# Patient Record
Sex: Female | Born: 1955 | Race: White | Hispanic: No | Smoking: Never smoker
Health system: Southern US, Community
[De-identification: ages and names within clinical notes are randomized; demographics above are authoritative.]

## PROBLEM LIST (undated history)

## (undated) DIAGNOSIS — M545 Low back pain, unspecified: Secondary | ICD-10-CM

## (undated) DIAGNOSIS — N959 Unspecified menopausal and perimenopausal disorder: Secondary | ICD-10-CM

## (undated) DIAGNOSIS — D649 Anemia, unspecified: Secondary | ICD-10-CM

## (undated) DIAGNOSIS — F329 Major depressive disorder, single episode, unspecified: Secondary | ICD-10-CM

## (undated) DIAGNOSIS — F3289 Other specified depressive episodes: Secondary | ICD-10-CM

## (undated) DIAGNOSIS — F411 Generalized anxiety disorder: Secondary | ICD-10-CM

## (undated) DIAGNOSIS — K219 Gastro-esophageal reflux disease without esophagitis: Secondary | ICD-10-CM

## (undated) DIAGNOSIS — E785 Hyperlipidemia, unspecified: Secondary | ICD-10-CM

## (undated) DIAGNOSIS — Z86010 Personal history of colonic polyps: Secondary | ICD-10-CM

## (undated) HISTORY — DX: Low back pain: M54.5

## (undated) HISTORY — PX: TUBAL LIGATION: SHX77

## (undated) HISTORY — DX: Unspecified menopausal and perimenopausal disorder: N95.9

## (undated) HISTORY — DX: Other specified depressive episodes: F32.89

## (undated) HISTORY — DX: Anemia, unspecified: D64.9

## (undated) HISTORY — DX: Major depressive disorder, single episode, unspecified: F32.9

## (undated) HISTORY — DX: Personal history of colonic polyps: Z86.010

## (undated) HISTORY — DX: Gastro-esophageal reflux disease without esophagitis: K21.9

## (undated) HISTORY — DX: Hyperlipidemia, unspecified: E78.5

## (undated) HISTORY — PX: TONSILLECTOMY AND ADENOIDECTOMY: SUR1326

## (undated) HISTORY — DX: Generalized anxiety disorder: F41.1

## (undated) HISTORY — DX: Low back pain, unspecified: M54.50

---

## 2000-07-06 ENCOUNTER — Encounter: Payer: Self-pay | Admitting: Gastroenterology

## 2000-07-06 ENCOUNTER — Encounter (INDEPENDENT_AMBULATORY_CARE_PROVIDER_SITE_OTHER): Payer: Self-pay | Admitting: Specialist

## 2000-07-06 ENCOUNTER — Other Ambulatory Visit: Admission: RE | Admit: 2000-07-06 | Discharge: 2000-07-06 | Payer: Self-pay | Admitting: Gastroenterology

## 2003-03-10 HISTORY — PX: UPPER GASTROINTESTINAL ENDOSCOPY: SHX188

## 2004-03-20 ENCOUNTER — Ambulatory Visit: Payer: Self-pay | Admitting: Internal Medicine

## 2004-04-01 ENCOUNTER — Ambulatory Visit: Payer: Self-pay | Admitting: Internal Medicine

## 2004-04-23 ENCOUNTER — Ambulatory Visit: Payer: Self-pay | Admitting: Internal Medicine

## 2005-04-28 ENCOUNTER — Ambulatory Visit: Payer: Self-pay | Admitting: Internal Medicine

## 2005-05-21 ENCOUNTER — Ambulatory Visit: Payer: Self-pay | Admitting: Internal Medicine

## 2005-05-21 ENCOUNTER — Ambulatory Visit: Payer: Self-pay | Admitting: Cardiology

## 2005-05-21 ENCOUNTER — Inpatient Hospital Stay (HOSPITAL_COMMUNITY): Admission: AD | Admit: 2005-05-21 | Discharge: 2005-05-25 | Payer: Self-pay | Admitting: Internal Medicine

## 2005-05-22 ENCOUNTER — Encounter: Payer: Self-pay | Admitting: Internal Medicine

## 2006-02-05 ENCOUNTER — Ambulatory Visit: Payer: Self-pay | Admitting: Internal Medicine

## 2006-07-27 ENCOUNTER — Encounter: Payer: Self-pay | Admitting: Internal Medicine

## 2007-02-21 IMAGING — CR DG CHEST 1V PORT
1 series · 1 of 1 positions shown · non-contrast
Comparison: None.

CLINICAL DATA: Left chest pain.  
 PORTABLE CHEST - 1 VIEW:

[view not recorded]
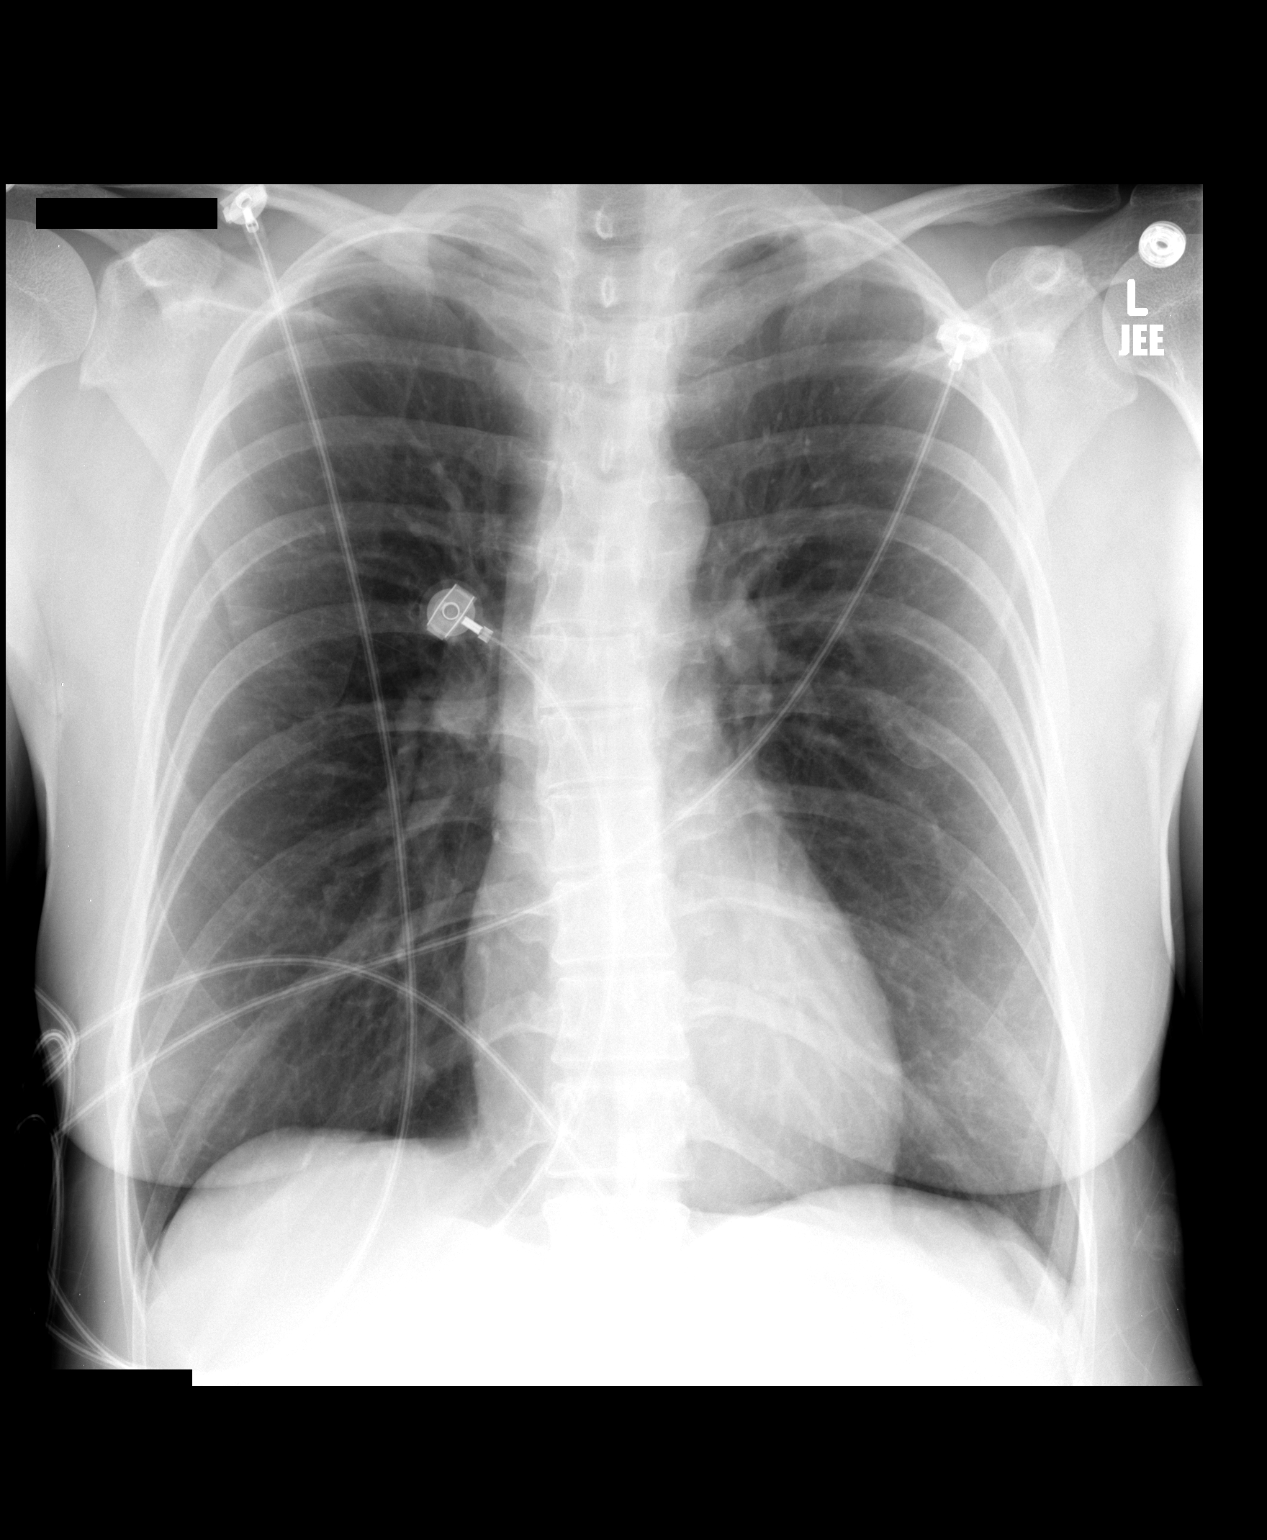

[1 of 1 positions shown; findings below may reference images not displayed]

FINDINGS: Heart and vascularity normal.  Lungs clear.  There may be some hyperaeration raising the question of COPD.   There is no acute process.
IMPRESSION: Possible COPD - no active disease.

## 2007-03-10 HISTORY — PX: REFRACTIVE SURGERY: SHX103

## 2007-05-30 ENCOUNTER — Ambulatory Visit: Payer: Self-pay | Admitting: Internal Medicine

## 2007-05-30 DIAGNOSIS — D130 Benign neoplasm of esophagus: Secondary | ICD-10-CM

## 2007-05-30 DIAGNOSIS — F329 Major depressive disorder, single episode, unspecified: Secondary | ICD-10-CM

## 2007-05-30 DIAGNOSIS — E785 Hyperlipidemia, unspecified: Secondary | ICD-10-CM

## 2007-05-30 DIAGNOSIS — F411 Generalized anxiety disorder: Secondary | ICD-10-CM | POA: Insufficient documentation

## 2007-06-01 ENCOUNTER — Encounter (INDEPENDENT_AMBULATORY_CARE_PROVIDER_SITE_OTHER): Payer: Self-pay | Admitting: *Deleted

## 2007-06-06 ENCOUNTER — Encounter (INDEPENDENT_AMBULATORY_CARE_PROVIDER_SITE_OTHER): Payer: Self-pay | Admitting: *Deleted

## 2007-07-18 ENCOUNTER — Encounter: Payer: Self-pay | Admitting: Internal Medicine

## 2008-03-09 DIAGNOSIS — Z860100 Personal history of colon polyps, unspecified: Secondary | ICD-10-CM

## 2008-03-09 HISTORY — DX: Personal history of colonic polyps: Z86.010

## 2008-03-09 HISTORY — DX: Personal history of colon polyps, unspecified: Z86.0100

## 2008-03-09 HISTORY — PX: COLONOSCOPY W/ POLYPECTOMY: SHX1380

## 2008-05-31 ENCOUNTER — Encounter: Payer: Self-pay | Admitting: Internal Medicine

## 2008-06-14 ENCOUNTER — Ambulatory Visit: Payer: Self-pay | Admitting: Gastroenterology

## 2008-06-14 ENCOUNTER — Encounter (INDEPENDENT_AMBULATORY_CARE_PROVIDER_SITE_OTHER): Payer: Self-pay | Admitting: *Deleted

## 2008-06-14 DIAGNOSIS — K219 Gastro-esophageal reflux disease without esophagitis: Secondary | ICD-10-CM

## 2008-07-04 ENCOUNTER — Encounter (INDEPENDENT_AMBULATORY_CARE_PROVIDER_SITE_OTHER): Payer: Self-pay | Admitting: *Deleted

## 2009-01-24 ENCOUNTER — Ambulatory Visit: Payer: Self-pay | Admitting: Internal Medicine

## 2009-01-24 DIAGNOSIS — N959 Unspecified menopausal and perimenopausal disorder: Secondary | ICD-10-CM | POA: Insufficient documentation

## 2009-01-24 DIAGNOSIS — M545 Low back pain, unspecified: Secondary | ICD-10-CM | POA: Insufficient documentation

## 2009-02-13 ENCOUNTER — Encounter (INDEPENDENT_AMBULATORY_CARE_PROVIDER_SITE_OTHER): Payer: Self-pay | Admitting: *Deleted

## 2009-02-14 ENCOUNTER — Ambulatory Visit: Payer: Self-pay | Admitting: Gastroenterology

## 2009-02-19 ENCOUNTER — Ambulatory Visit: Payer: Self-pay | Admitting: Gastroenterology

## 2009-02-19 LAB — HM COLONOSCOPY

## 2009-03-05 ENCOUNTER — Encounter: Payer: Self-pay | Admitting: Gastroenterology

## 2009-03-07 ENCOUNTER — Encounter: Payer: Self-pay | Admitting: Internal Medicine

## 2009-03-11 ENCOUNTER — Telehealth: Payer: Self-pay | Admitting: Gastroenterology

## 2009-03-14 ENCOUNTER — Encounter: Payer: Self-pay | Admitting: Gastroenterology

## 2009-03-14 ENCOUNTER — Ambulatory Visit (HOSPITAL_COMMUNITY): Admission: RE | Admit: 2009-03-14 | Discharge: 2009-03-14 | Payer: Self-pay | Admitting: Gastroenterology

## 2009-03-17 ENCOUNTER — Ambulatory Visit: Payer: Self-pay | Admitting: Gastroenterology

## 2009-05-08 ENCOUNTER — Telehealth (INDEPENDENT_AMBULATORY_CARE_PROVIDER_SITE_OTHER): Payer: Self-pay | Admitting: *Deleted

## 2009-05-09 ENCOUNTER — Ambulatory Visit: Payer: Self-pay | Admitting: Internal Medicine

## 2009-05-09 LAB — CONVERTED CEMR LAB
AST: 17 units/L (ref 0–37)
Alkaline Phosphatase: 58 units/L (ref 39–117)
Bilirubin, Direct: 0.1 mg/dL (ref 0.0–0.3)
Cholesterol: 174 mg/dL (ref 0–200)
HDL: 76.3 mg/dL (ref >39.00)
LDL Cholesterol: 87 mg/dL (ref 0–99)
Total Bilirubin: 0.4 mg/dL (ref 0.3–1.2)
Total Protein: 6.8 g/dL (ref 6.0–8.3)
Triglycerides: 55 mg/dL (ref 0.0–149.0)

## 2009-05-21 ENCOUNTER — Telehealth (INDEPENDENT_AMBULATORY_CARE_PROVIDER_SITE_OTHER): Payer: Self-pay | Admitting: *Deleted

## 2010-03-06 ENCOUNTER — Encounter: Payer: Self-pay | Admitting: Internal Medicine

## 2010-03-06 ENCOUNTER — Ambulatory Visit: Payer: Self-pay | Admitting: Internal Medicine

## 2010-03-06 DIAGNOSIS — Z860100 Personal history of colon polyps, unspecified: Secondary | ICD-10-CM | POA: Insufficient documentation

## 2010-03-06 LAB — CONVERTED CEMR LAB: LDL Goal: 160 mg/dL

## 2010-03-07 LAB — CONVERTED CEMR LAB
ALT: 37 units/L — ABNORMAL HIGH (ref 0–35)
Alkaline Phosphatase: 62 units/L (ref 39–117)
Basophils Relative: 0.4 % (ref 0.0–3.0)
CO2: 29 meq/L (ref 19–32)
Creatinine, Ser: 0.9 mg/dL (ref 0.4–1.2)
Direct LDL: 117.7 mg/dL
Eosinophils Relative: 0.6 % (ref 0.0–5.0)
GFR calc non Af Amer: 69.49 mL/min (ref >60.00)
HCT: 38.4 % (ref 36.0–46.0)
Lymphocytes Relative: 31.2 % (ref 12.0–46.0)
Lymphs Abs: 1.8 10*3/uL (ref 0.7–4.0)
Monocytes Absolute: 0.5 10*3/uL (ref 0.1–1.0)
Neutrophils Relative %: 60 % (ref 43.0–77.0)
Potassium: 4.9 meq/L (ref 3.5–5.1)
RBC: 4.3 M/uL (ref 3.87–5.11)
Sodium: 138 meq/L (ref 135–145)
TSH: 1.35 microintl units/mL (ref 0.35–5.50)
Total CHOL/HDL Ratio: 3
WBC: 5.9 10*3/uL (ref 4.5–10.5)

## 2010-04-06 LAB — CONVERTED CEMR LAB
BUN: 15 mg/dL (ref 6–23)
Bilirubin, Direct: 0.1 mg/dL (ref 0.0–0.3)
CO2: 31 meq/L (ref 19–32)
Calcium: 9.6 mg/dL (ref 8.4–10.5)
Chloride: 102 meq/L (ref 96–112)
GFR calc Af Amer: 75 mL/min
GFR calc non Af Amer: 62 mL/min
Glucose, Bld: 80 mg/dL (ref 70–99)
HCT: 39.9 % (ref 36.0–46.0)
Hemoglobin: 13.3 g/dL (ref 12.0–15.0)
Lymphocytes Relative: 42.3 % (ref 12.0–46.0)
MCHC: 33.5 g/dL (ref 30.0–36.0)
Neutrophils Relative %: 35.3 % — ABNORMAL LOW (ref 43.0–77.0)
Platelets: 190 10*3/uL (ref 150–400)
Potassium: 5.1 meq/L (ref 3.5–5.1)
Sodium: 139 meq/L (ref 135–145)

## 2010-04-10 NOTE — Procedures (Signed)
Summary: Upper Endoscopy  Patient: Penny Mccormick Note: All result statuses are Final unless otherwise noted.  Tests: (1) Upper Endoscopy (EGD)   EGD Upper Endoscopy       DONE     [facility]     [address] [place], [place]  [address]           ENDOSCOPY PROCEDURE REPORT           PATIENT:  Penny Mccormick, Penny Mccormick  MR#:  [id number]     BIRTHDATE:  09/19/1955, 53 yrs. old  GENDER:  female           ENDOSCOPIST:  Barbette Hair. Arlyce Dice, MD     Referred by:           PROCEDURE DATE:  03/14/2009     PROCEDURE:  EGD, diagnostic     ASA CLASS:  Class I     INDICATIONS:  abdominal pain           MEDICATIONS:   Fentanyl 75 mcg IV, Versed 7.5 mg IV, Benadryl 25     mg IV, glycopyrrolate (Robinal) 0.2 mg IV     TOPICAL ANESTHETIC:  Cetacaine Spray           DESCRIPTION OF PROCEDURE:   After the risks benefits and     alternatives of the procedure were thoroughly explained, informed     consent was obtained.  The EG-[id number] ([id number]) endoscope was     introduced through the mouth and advanced to the third portion of     the duodenum, without limitations.  The instrument was slowly     withdrawn as the mucosa was fully examined.     <<PROCEDUREIMAGES>>           The upper, middle, and distal third of the esophagus were     carefully inspected and no abnormalities were noted. The z-line     was well seen at the GEJ. The endoscope was pushed into the fundus     which was normal including a retroflexed view. The antrum,gastric     body, first and second part of the duodenum were unremarkable (see     image001, image002, image003, image004, image005, image006, and     image007).    Retroflexed views revealed no abnormalities.    The     scope was then withdrawn from the patient and the procedure     completed.           COMPLICATIONS:  None           ENDOSCOPIC IMPRESSION:     1) Normal EGD     RECOMMENDATIONS:     1) Call office next 2-3 days to schedule an office appointment  for 2 weeks     2) begin omeprazole 20mg  daily and hyomax as needed           REPEAT EXAM:  No           ______________________________     Barbette Hair. Arlyce Dice, MD           CC:  Penny Lawless, MD           n.     Rosalie Doctor:   Barbette Hair. Kaplan at 03/14/2009 01:11 PM           Denna Haggard, [id number]  Note: An exclamation mark (!) indicates a result that was not dispersed into the flowsheet. Document Creation Date: 03/14/2009 1:11 PM _______________________________________________________________________  Marland Kitchen  1) Order result status: Final Collection or observation date-time: 03/14/2009 13:07 Requested date-time:  Receipt date-time:  Reported date-time:  Referring Physician:   Ordering Physician: Melvia Heaps [phone]) Specimen Source:  Source: Launa Grill Order Number: [id number] Lab site:

## 2010-04-10 NOTE — Miscellaneous (Signed)
  Clinical Lists Changes  Medications: Removed medication of RANITIDINE HCL 150 MG TABS (RANITIDINE HCL) 1 two times a day pre meal Added new medication of OMEPRAZOLE 20 MG  CPDR (OMEPRAZOLE) 1 each day 30 minutes before meal - Signed Rx of OMEPRAZOLE 20 MG  CPDR (OMEPRAZOLE) 1 each day 30 minutes before meal;  #30 x 2;  Signed;  Entered by: Louis Meckel MD;  Authorized by: Louis Meckel MD;  Method used: Electronically to [facility] [address] [place], [place], [place]  [address], Ph: [phone], Fax: [phone]    Prescriptions: OMEPRAZOLE 20 MG  CPDR (OMEPRAZOLE) 1 each day 30 minutes before meal  #30 x 2   Entered and Authorized by:   Louis Meckel MD   Signed by:   Louis Meckel MD on 03/14/2009   Method used:   Electronically to        [facility]       [address]       [place], [place]  [address]       Ph: [phone]       Fax: [phone]   RxID:   [id number]

## 2010-04-10 NOTE — Miscellaneous (Signed)
  Clinical Lists Changes  Medications: Added new medication of HYOMAX-DT 0.375 MG CR-TABS (HYOSCYAMINE SULFATE) take 1 tab two times a day as needed abdominal pain - Signed Rx of HYOMAX-DT 0.375 MG CR-TABS (HYOSCYAMINE SULFATE) take 1 tab two times a day as needed abdominal pain;  #25 x 1;  Signed;  Entered by: Louis Meckel MD;  Authorized by: Louis Meckel MD;  Method used: Electronically to [facility] [address] [place], [place], [place]  [address], Ph: [phone], Fax: [phone]    Prescriptions: HYOMAX-DT 0.375 MG CR-TABS (HYOSCYAMINE SULFATE) take 1 tab two times a day as needed abdominal pain  #25 x 1   Entered and Authorized by:   Louis Meckel MD   Signed by:   Louis Meckel MD on 03/14/2009   Method used:   Electronically to        [facility])       [address]       [place], [place]  [address]       Ph: [phone]       Fax: [phone]   RxID:   [id number]

## 2010-04-10 NOTE — Progress Notes (Signed)
Summary: Request for Lab Results  Phone Note Call from Patient Call back at Work Phone [phone] Call back at ext: [phone]   Caller: Patient Summary of Call: Message left on VM: Patient would like chlosterol results, paitent needs to know if she is to continue Pravastatin at the same dose. Genia Harold.  I spoke with patient and informed her chlosterol panel is better when compared to previous chlosterol panel, ALL values improved. Patient was  informed Dr.Hopper will review report and decide about chlosterol med ( if patient is to continue the same dose of half dose). Then [facility] [address]. Patient said it is ok to just forward RX and call her HOME phone and leave a detailed message on machine with Dr.Hopper's decision. Copy of labs to be mailed once signed off on./Chrae Westwood/[facility]  May 21, 2009 9:05 AM     Follow-up for Phone Call        Labs GREAT on tiny dose of Pravastatin; no change . Follow up in 01/2010. [facility] Follow-up by: Marga Melnick MD,  May 21, 2009 6:05 PM  Additional Follow-up for Phone Call Additional follow up Details #1::        Labs mailed, along with phone note Additional Follow-up by: Shonna Chock,  May 22, 2009 9:04 AM    Prescriptions: PRAVASTATIN SODIUM 20 MG TABS (PRAVASTATIN SODIUM) 1 at bedtime (fasting labs after 10 weeks of med)  #90 x 2   Entered and Authorized by:   Marga Melnick MD   Signed by:   Marga Melnick MD on 05/21/2009   Method used:   Faxed to ...       [facility]       [address]       [place], [place]  [address]       Ph: [phone]       Fax: [phone]   RxID:   [id number]
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
## 2010-04-10 NOTE — Progress Notes (Signed)
Summary: TRIAGE  Phone Note Call from Patient Call back at [phone]---- X [phone]   Caller: Patient Call For: Arlyce Dice Reason for Call: Talk to Nurse Summary of Call: Patient states that she has severe stomach pain with or without food and has nausea wants to know if she can be seen before appt date 04-11-2009 Initial call taken by: Tawni Levy,  March 11, 2009 9:45 AM  Follow-up for Phone Call        Pt. c/o 3 monthes of epigastric pain, constant dull ache that gets worse when she is hungry. Nausea getting more frequent.  Is taking Ranitidine 150mg  two times a day. Pain is getting worse, appetite is decreasing.   1) See Willette Cluster NP on 03-14-09 at 9am 2) Stop Ranitidine and use Prilosec OTC two times a day 3) Soft,bland diet. No spicy,greasy,fried foods. 4) If symptoms become worse call back immediately or go to ER.  Follow-up by: Laureen Ochs LPN,  March 11, 2009 10:02 AM  Additional Follow-up for Phone Call Additional follow up Details #1::        she needs a EGD - can schedule and cx OV. Additional Follow-up by: Louis Meckel MD,  March 11, 2009 10:31 AM    Additional Follow-up for Phone Call Additional follow up Details #2::    Above MD orders reviewed with patient. Pt. scheduled Endoscopy for 03-14-09 at 12:30pm at [facility]. All instructions reviewed with pt. by phone. Pt. instructed to call back as needed.   Follow-up by: Laureen Ochs LPN,  March 11, 2009 12:41 PM

## 2010-04-10 NOTE — Progress Notes (Signed)
Summary: ? when labs due  Phone Note Call from Patient Call back at Work Phone [phone] Call back at [phone]   Caller: Patient Summary of Call: Message left on VM: Please call to discuss when labs should be rechecked  Spoke with patient, based on NMR from 01/2009 labs to be rechecked in 10 weeks (Due Now), schedule appointment for tomorrow at [facility]  May 08, 2009 11:44 AM

## 2010-04-24 ENCOUNTER — Encounter: Payer: Self-pay | Admitting: Internal Medicine

## 2010-06-10 ENCOUNTER — Other Ambulatory Visit: Payer: Self-pay | Admitting: Obstetrics

## 2010-07-11 ENCOUNTER — Encounter: Payer: Self-pay | Admitting: Internal Medicine

## 2010-07-25 NOTE — Cardiovascular Report (Signed)
NAMEANIELLE, HEADRICK               ACCOUNT NO.:  [id number]   MEDICAL RECORD NO.:  [id number]          PATIENT TYPE:  INP   LOCATION:  3710                         FACILITY:  [facility]   PHYSICIAN:  Arturo Morton. Riley Kill, M.D. 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 We initially elected a conservative course. She had  recurrent pain requiring IV heparin and nitroglycerin. She was seen by Dr.  Dietrich Pates and cardiac catheterization recommended. Risks, benefits and  alternatives were discussed with the patient her family. They agreed to  proceed.   PROCEDURES:  1.  Left heart catheterization  2.  Selective coronary arteriography.  3.  Selective left ventriculography.   DESCRIPTION OF PROCEDURE:  The procedure was performed from the right  femoral artery using 4-French catheters. She tolerated the procedure without  complications. She was taken to the holding area in satisfactory clinical  condition for direct manual hemostasis.   HEMODYNAMIC DATA:  1.  Central aortic pressure 100/60, mean 79.  2.  Left ventricular pressure 97/12.  3.  No gradient on pullback across the aortic valve.   ANGIOGRAPHIC DATA:  1.  The right coronary is a very large-caliber vessel providing a posterior      descending and posterolateral vessel. There are several small tiny      posterolateral vessels as well. A small acute marginal branch. The right      coronary artery throughout was free of critical disease.  2.  The left main coronary artery was free of critical disease.  3.  The left anterior descending artery courses to the apex. The LAD      provided a major diagonal branch. There are multiple septal perforators      and then a small second diagonal. The vessel terminated at the apex and      was free of  critical disease.  4.  The circumflex provided a ramus intermedius vessel. There was a steep      bend in the artery just after the takeoff of the first marginal with      perhaps minimal luminal irregularity but no evidence of high-grade focal      obstruction. There was no evidence of coronary calcification.  5.  Ventriculography performed in the RAO projection revealed vigorous      global systolic function without segmental wall motion abnormality.   CONCLUSION:  1.  Well-preserved overall left ventricular function.  2.  No evidence of significant high-grade focal coronary obstruction.   DISPOSITION:  The patient has had a negative D-dimer. Her coronary angiogram  was without significant obstruction. We did not take a full picture of the  aorta, but there is not evident aortic dilatation on the ventriculogram. The  patient may need further evaluation for other potential sources of the chest  discomfort.      Arturo Morton. Riley Kill, M.D. [facility]  Electronically Signed     TDS/MEDQ  D:  05/25/2005  T:  05/25/2005  Job:  [id number]   cc:   [place] Bing, M.D. [facility]  [address]  [address]  [place]  [place] [address]   Titus Dubin. Alwyn Ren, M.D. [facility]  [id number] [place]  [place] [address]   CV Laboratory   Patient's medical record

## 2010-07-25 NOTE — Discharge Summary (Signed)
NAMEHADDIE, Penny Mccormick               ACCOUNT NO.:  [id number]   MEDICAL RECORD NO.:  [id number]          PATIENT TYPE:  INP   LOCATION:  3710                         FACILITY:  [facility]   PHYSICIAN:  Arturo Morton. Riley Kill, M.D. [facility] OF BIRTH:  [birth year]   DATE OF ADMISSION:  05/21/2005  DATE OF DISCHARGE:  05/25/2005                           DISCHARGE SUMMARY - REFERRING   DISCHARGE DIAGNOSES:  1.  Noncardiac chest discomfort.  2.  No coronary artery disease by cardiac catheterization. History as noted      below.   PROCEDURE PERFORMED:  Cardiac catheterization on May 25, 2005, by Dr.  Riley Kill.   HISTORY:  Penny Mccormick is a 54 year old white female who had evaluation with  Dr. Alwyn Ren in his office secondary to chest tightness and fatigue. She state  that her symptoms began on March 8th as a general malaise. History on the  10th she noticed chest tightness. 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 She has continued to have chest tightness that has been  constant and nonproductive cough, thus her admission for further evaluation.   PAST MEDICAL HISTORY:  1.  Anemia.  2.  Anxiety disorder.  3.  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   LABORATORY DATA:  Chest x-ray on admission showed possible COPD, no active  disease. Admission H&H was of 12.3 and 35.3, normal indices, platelets  233,000, WBC 7.9. PTT 25, PT 12.4. D-dimer less than 0.22. Sodium 141,  potassium 3.8, BUN 7, creatinine 1.1, glucose 100, magnesium 2.3. Subsequent  hematologies and chemistries were unremarkable. At the time of discharge H&H  was 12.4 and 35.9 with normal indices, platelets 200,000, WBC 8.4. Sodium  139, potassium 3.5, BUN 12, creatinine 1.0, glucose 109, normal LFTs. CK,  MB, and troponins were negative times six.  EKG showed normal sinus rhythm,  normal axis, nonspecific ST-T wave changes.   HOSPITAL COURSE:  Ms.  Mccormick was admitted to [facility] by Dr.  Alwyn Ren. Cardiology was asked to become involved by consultation. Tereso Newcomer and Dr. Dietrich Pates saw the patient on May 21, 2005, and initial  thoughts were to undergo a stress Myoview for further evaluation. An  echocardiogram was also performed on the 16th. This showed an EF of 65%, no  evidence of pericardial fusion, wall motion abnormalities, or valvular  abnormalities. Case management became involved to assist with discharge  needs. Nursing notes document several episodes of chest discomfort despite  nitroglycerin. EKGs do not show any acute changes. Given her recurring chest  discomfort Dr. Riley Kill felt that she should undergo cardiac catheterization.  Her care was transferred to [facility]. On May 25, 2005, Dr.  Riley Kill performed cardiac catheterization. This did not show any coronary  artery disease. Post sheath removal and bedrest it was felt that she could  be discharged home with further outpatient follow-up coordinated by Dr.  Alwyn Ren.   DISPOSITION:  Penny Mccormick, post ambulation, was discharged home. She was  asked to maintain a low-salt, low-fat, low-cholesterol diet. Her activities  and wound care are per the post catheterization supplemental sheet. She is  asked to continue her medications which include Zovia 1+ 35 mg daily,  Prevacid 30 mg daily, Wellbutrin SR 150 mg  daily. She is asked to call to arrange a follow-up appointment with Dr.  Alwyn Ren for further evaluation of her malaise and chest discomfort. She was  asked to call our office for any problems with her catheterization site.   Discharge time less than 30 minutes.      Joellyn Rued, P.A. [facility]      Thomas D. Riley Kill, M.D. [facility]  Electronically Signed    EW/MEDQ  D:  05/25/2005  T:  05/25/2005  Job:  [id number]   cc:   Titus Dubin. Alwyn Ren, M.D. [facility]  [phone] [address] [place]  [place] [address]

## 2010-07-25 NOTE — Consult Note (Signed)
Penny Mccormick, Penny Mccormick NO.:  [id number]   MEDICAL RECORD NO.:  [id number]          PATIENT TYPE:  INP   LOCATION:  2039                         FACILITY:  [facility]   PHYSICIAN:  [place] Bing, M.D. [facility] OF BIRTH:  [birth year]   DATE OF CONSULTATION:  05/21/2005  DATE OF DISCHARGE:                                   CONSULTATION   REFERRING PHYSICIAN:  Titus Dubin. Alwyn Ren, M.D.   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  Ms. Penny Mccormick has never previously been hospitalized.  She has no major chronic  medical problems.  She has not had hypertension, diabetes, hyperlipidemia  nor used tobacco products.  Over the past few day, she has experienced mild  to moderate intermittent chest tightness.  This is fairly diffuse over the  entire upper and mid chest.  There is no radiation.  There are no true  associated symptoms.  She may have had some slight lightheadedness and  minimal dyspnea.  Episodes typically have lasted one or two hours.  There is  no relationship to exertion.  She knows of nothing that brings on the  discomfort nor anything that relieves it.  She has had GERD in the past, and  this is nothing like it.  She was seen by her primary care doctor today, who  transferred her by ambulance to [facility] for direct admission.   PAST MEDICAL HISTORY:  Benign except as noted above.   MEDICATIONS:  Oral contraceptives, Prevacid and Wellbutrin.  She has  received no new medications in recent weeks.   She has had no illnesses over the past few weeks.  Nonetheless, EKG taken  April 28, 2005, was be entirely normal except for delayed R-wave  progression.  EKG now shows downsloping ST segments in the inferior leads  with some slight ST-segment flattening in V4-V6.   SOCIAL HISTORY:  Works for a Scientist, product/process development; no excessive use of  alcohol.   REVIEW OF SYSTEMS:  Noncontributory.   PHYSICAL EXAMINATION:  GENERAL:  On exam, pleasant, well-appearing, trim  woman in no acute distress.  VITAL SIGNS:  The heart rate is 70 and regular, blood pressure 115/75,  respirations 20, temperature 97.8, afebrile.  HEENT:  Normal sclerae and conjunctivae; normal oral mucosa.  NECK:  No jugular venous distension; normal carotid upstrokes without  bruits.  LUNGS:  Clear.  CARDIAC:  Normal first and second heart sounds; normal PMI.  ABDOMEN:  Soft and nontender; no organomegaly.  EXTREMITIES:  No edema; normal distal pulses. a  NEUROMUSCULAR:  Symmetric strength and tone; normal cranial nerves.  MUSCULOSKELETAL:  No joint deformities.   Initial laboratory including a CBC, chemistry profile, D-dimer, TSH level  and chest x-ray, are within normal limits.  Initial cardiac markers are  normal.   IMPRESSION:  Clinical dilemma with a very low likelihood of coronary disease  but no alternative explanation for fairly significant EKG changes.  A  magnesium level will be obtained but not likely to be helpful.  An  echocardiogram will be obtained looking for unexpected left ventricular  hypertrophy or pericardial fluid.  We will attempt to obtain an EKG while  she is experiencing symptoms.  She will not be treated with medications  other than her usual drugs.  Additional cardiac markers and EKGs will be  obtained.  Based upon results of this initial evaluation, a decision  regarding appropriate additional testing can be formulated.      [place] Bing, M.D. [facility]  Electronically Signed     RR/MEDQ  D:  05/21/2005  T:  05/23/2005  Job:  [id number]
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
## 2010-10-27 ENCOUNTER — Telehealth: Payer: Self-pay | Admitting: Gastroenterology

## 2010-10-27 NOTE — Telephone Encounter (Signed)
Left message for pt to call back  °

## 2010-10-28 ENCOUNTER — Telehealth: Payer: Self-pay | Admitting: Internal Medicine

## 2010-10-28 NOTE — Telephone Encounter (Signed)
Left message for pt to call back  °

## 2010-10-28 NOTE — Telephone Encounter (Signed)
Patient's husband was in office to schedule appt for patient, he said she has been having chest pains for several weeks or longer & doesn't know if it is heart related or gi related wanted an appt Thursday  [id number].  He said she was seen at our office once before & was sent via ambulance to the hospital.  I spoke with Chemira she told husband if pain got worse patient was to go to ed. Patient has appt for [id number]. I reminded husband of med center ed.

## 2010-10-28 NOTE — Telephone Encounter (Signed)
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 Advised if no better today would need to go to ed. Otherwise appt was scheduled for Thursday at their request pt wasn't in office sent husband to schedule appt.

## 2010-10-29 NOTE — Telephone Encounter (Signed)
Left message for pt to call back  °

## 2010-10-30 ENCOUNTER — Encounter: Payer: Self-pay | Admitting: Internal Medicine

## 2010-10-30 ENCOUNTER — Ambulatory Visit (INDEPENDENT_AMBULATORY_CARE_PROVIDER_SITE_OTHER): Payer: Commercial Managed Care - PPO | Admitting: Internal Medicine

## 2010-10-30 DIAGNOSIS — R21 Rash and other nonspecific skin eruption: Secondary | ICD-10-CM

## 2010-10-30 DIAGNOSIS — N390 Urinary tract infection, site not specified: Secondary | ICD-10-CM

## 2010-10-30 DIAGNOSIS — J0100 Acute maxillary sinusitis, unspecified: Secondary | ICD-10-CM

## 2010-10-30 DIAGNOSIS — R102 Pelvic and perineal pain: Secondary | ICD-10-CM

## 2010-10-30 DIAGNOSIS — E785 Hyperlipidemia, unspecified: Secondary | ICD-10-CM

## 2010-10-30 DIAGNOSIS — R319 Hematuria, unspecified: Secondary | ICD-10-CM

## 2010-10-30 DIAGNOSIS — N949 Unspecified condition associated with female genital organs and menstrual cycle: Secondary | ICD-10-CM

## 2010-10-30 LAB — POCT URINALYSIS DIPSTICK
Glucose, UA: NEGATIVE
Protein, UA: NEGATIVE
pH, UA: 6

## 2010-10-30 LAB — LDL CHOLESTEROL, DIRECT: Direct LDL: 138.4 mg/dL

## 2010-10-30 LAB — LIPID PANEL
Total CHOL/HDL Ratio: 3
VLDL: 13.2 mg/dL (ref 0.0–40.0)

## 2010-10-30 MED ORDER — FLUTICASONE PROPIONATE 50 MCG/ACT NA SUSP: 1.0000 | NASAL | Status: DC

## 2010-10-30 MED ORDER — AMOXICILLIN-POT CLAVULANATE 875-125 MG PO TABS: 1.0000 | ORAL_TABLET | Freq: Two times a day (BID) | ORAL | Status: AC

## 2010-10-30 MED ORDER — MOMETASONE FUROATE 0.1 % EX CREA: TOPICAL_CREAM | CUTANEOUS | Status: DC

## 2010-10-30 MED ORDER — AMOXICILLIN-POT CLAVULANATE 875-125 MG PO TABS: 1.0000 | ORAL_TABLET | Freq: Two times a day (BID) | ORAL | Status: DC

## 2010-10-30 MED ORDER — MOMETASONE FUROATE 0.1 % EX CREA: TOPICAL_CREAM | CUTANEOUS | Status: AC

## 2010-10-30 NOTE — Progress Notes (Signed)
Subjective:    Patient ID: Penny Mccormick, female    DOB: Dec 31, 1955, 55 y.o.   MRN: [id number]  HPI Respiratory tract infection Onset/symptoms:8/11 as rhinitis, congestion Exposures (illness/environmental/extrinsic):no Progression of symptoms:initial improvement until 8/20 when fatigue occurred Treatments/response:Claritin , Delsym, Nasonex Present symptoms:facial pressure & laryngitis Fever/chills/sweats:no Frontal headache:no Nasal purulence:no Sore throat:not now Dental pain:no Lymphadenopathy:no Wheezing/shortness of breath:no Cough/sputum:dry cough Associated extrinsic/allergic symptoms:itchy eyes/ sneezing:no Past medical history: Seasonal allergies; in Fall/asthma:no Smoking history:never           Review of Systems she has been evaluated in detail by her GYN & the urologists for recurrent urinary tract infections with hematuria.She has had CT scans of pelvis & abdomen. Cystoscopy has been discussed. Urinary tract infections often present suprapubic discomfort which he is having now. She also questions having vaginitis.  Diflucan was partial benefit. She denies dysuria, pyuria or visible hematuria.  In the last 48+ hours she developed rash over the shins. This is not pruritic or tender.     Objective:   Physical Exam  Gen.: Healthy and well-nourished in appearance. Alert, appropriate and cooperative throughout exam. Eyes: No corneal or conjunctival inflammation noted. No icterusEars: External  ear exam reveals no significant lesions or deformities. Canals clear .TMs normal.  Nose: External nasal exam reveals no deformity or inflammation. Nasal mucosa are pink and moist. No lesions or exudates noted. Septum  normal  Mouth: Oral mucosa and oropharynx reveal no lesions or exudates. Teeth in good repair. Hoarse Neck: No deformities, masses, or tenderness noted.  Thyroid  normal. Lungs: Normal respiratory effort; chest expands symmetrically. Lungs are clear to  auscultation without rales, wheezes, or increased work of breathing. Heart: Normal rate and rhythm. Normal S1 and S2. No gallop, click, or rub. No murmur. Abdomen: Bowel sounds normal; abdomen soft and nontender. No masses, organomegaly or hernias noted.                                                                                    Musculoskeletal/extremities: No clubbing, cyanosis, edema, or deformity noted. Nail health  good.  Neurologic: Alert and oriented x3. Deep tendon reflexes symmetrical and normal.          Skin: She has a diffuse, faintly pink, mildly raised rash  right shin > left. This is nontender and does not have an increased temperature to touch. Lymph: No cervical, axillary  lymphadenopathy present. Psych: Mood and affect are normal. Normally interactive                                                                                        Assessment & Plan:  #1 maxillary sinusitis  #2 suprapubic discomfort in the context of recurrent urinary tract infections and hematuria  #3 rash, contact dermatitis suggested clinically  #4 dyslipidemia, on statins since May 2 due  toconcerns of possible adverse effects  Plan: See orders and recommendations.

## 2010-10-30 NOTE — Telephone Encounter (Signed)
After multiple attempts have been unable to get in touch with this pt.

## 2010-10-30 NOTE — Patient Instructions (Signed)
Plain Mucinex for thick secretions ;force NON dairy fluids for next 48 hrs. Use a Neti pot daily as needed for sinus congestion 

## 2010-10-30 NOTE — Progress Notes (Signed)
Addended by: Doristine Devoid on: 10/30/2010 12:48 PM   Modules accepted: Orders

## 2010-11-02 LAB — URINE CULTURE: Colony Count: >=100000

## 2010-11-13 ENCOUNTER — Ambulatory Visit (INDEPENDENT_AMBULATORY_CARE_PROVIDER_SITE_OTHER): Payer: Commercial Managed Care - PPO | Admitting: Internal Medicine

## 2010-11-13 ENCOUNTER — Encounter: Payer: Self-pay | Admitting: Internal Medicine

## 2010-11-13 VITALS — BP 114/74 | HR 80 | Temp 98.3°F | Wt 151.6 lb

## 2010-11-13 DIAGNOSIS — R51 Headache: Secondary | ICD-10-CM

## 2010-11-13 DIAGNOSIS — R319 Hematuria, unspecified: Secondary | ICD-10-CM

## 2010-11-13 DIAGNOSIS — R1013 Epigastric pain: Secondary | ICD-10-CM

## 2010-11-13 DIAGNOSIS — E785 Hyperlipidemia, unspecified: Secondary | ICD-10-CM

## 2010-11-13 DIAGNOSIS — K3189 Other diseases of stomach and duodenum: Secondary | ICD-10-CM

## 2010-11-13 LAB — POCT URINALYSIS DIPSTICK
Glucose, UA: NEGATIVE
Ketones, UA: NEGATIVE
Urobilinogen, UA: 0.2

## 2010-11-13 MED ORDER — PRAVASTATIN SODIUM 20 MG PO TABS: 20.0000 mg | ORAL_TABLET | Freq: Every evening | ORAL | Status: DC

## 2010-11-13 NOTE — Patient Instructions (Addendum)
Please keep a diary of your headaches . Document  each occurrence on the calendar with notation of : #1 any prodrome ( any non headache symptom such as marked fatigue,visual changes, ,etc ) which precedes actual headache ; #2) severity on 1-10 scale; #3) any triggers ( food/ drink,enviromenntal or weather changes ,physical or emotional stress) in 8-12 hour period prior to the headache; & #4) response to any medications or other intervention. Please review "Headache" @ [facility] for additional information.    The triggers for dyspepsia or "heart burn"  include stress; the "aspirin family" ; alcohol; peppermint; and caffeine (coffee, tea, cola, and chocolate). The aspirin family would include aspirin and the nonsteroidal agents such as ibuprofen &  Naproxen. Tylenol would not cause reflux. If having dyspepsia ; food & drink should be avoided for @ least 2 hours before going to bed. Please  schedule fasting Labs in 10 weeks  : CK,Lipids, hepatic panel(272.4, 995.20)

## 2010-11-13 NOTE — Progress Notes (Signed)
Subjective:    Patient ID: Penny Mccormick, female    DOB: Mar 30, 1955, 55 y.o.   MRN: [id number]  HPI HEADACHE : Onset: 11/06/2010 upon awakening after flying to [place]; no recurrence since   Location: in temples & over bridge of nose  Quality: pressure pain Frequency: constant ;relieved with Toradol & Phenergan from sister's FP MD in [place]  Precipitating factors: no definite trigger Associated Symptoms Nausea/vomiting: severe nausea only Photophobia/phonophobia: no  Tearing of eyes: no  Sinus pain/pressure: yes,   PMH/Family hx migraine: yes, only in her mother, not her  Relation to menstrual cycle: no, post menopausal Red Flags Fever: no  Neck pain/stiffness: no  Vision/speech/swallow/hearing difficulty: no  Focal weakness/numbness: no  Altered mental status: no  Trauma: no  New type of headache: yes   She questioned  allergic component to the headaches, but she denies itchy eyes or sneezing.    Review of Systems the urine culture 10/30/2010 revealed greater than 100,000 Klebsiella. It was resistant to ampicillin and intermittently sensitive to nitrofurantoin. It was sensitive to the Septra DS which was prescribed.  She does have some suprapubic discomfort but denies  Fever, chills ,dysuria, pyuria, or hematuria.  She's been having dyspepsia and is concerned about possible cardiac component. She takes Prilosec over-the-counter as needed. She is scheduled to see gastroschisis the near future.     Objective:   Physical Exam General appearance is of good health and nourishment; no acute distress or increased work of breathing is present.  No  lymphadenopathy about the head, neck, or axilla noted.   Eyes: No conjunctival inflammation or lid edema is present. There is no scleral icterus. EOMI,vision & FOV normal.  Ears:  External ear exam shows no significant lesions or deformities.  Otoscopic examination reveals clear canals, tympanic membranes are intact bilaterally without  bulging, retraction, inflammation or discharge. Hearing normal  Nose:  External nasal examination shows no deformity or inflammation. Nasal mucosa are pink and moist without lesions or exudates. No septal dislocation or dislocation.No obstruction to airflow.   Oral exam: Dental hygiene is good; lips and gums are healthy appearing.There is no oropharyngeal erythema or exudate noted. Uvula small  Neck:  No deformities, thyromegaly, masses, or tenderness noted.   Supple with full range of motion without pain.   Heart:  Normal rate and regular rhythm. S1 and S2 normal without gallop, murmur, click, rub. S4   Lungs:Chest clear to auscultation; no wheezes, rhonchi,rales ,or rubs present.No increased work of breathing.    Extremities:  No cyanosis, edema, or clubbing noted     Neuro: Cranial nerve exam revealed no deficits;sensation to light touch was normal ; deep tendon reflexes were normal; gait  normal; balance normal ; Romberg/ finger to nose  testing normal.Strength & tone normal .    Skin: Warm & dry w/o jaundice or tenting. Abrasions over knees          Assessment & Plan:  #1 headache, this is probably related to barometric issues in flying and relocating to [place]. The history and exam do not suggest an extrinsic component. Neurologic exam is unremarkable without deficit  #2 Klebsiella urinary tract infection, residual suprapubic discomfort status post generic Septra DS to which it was sensitive    #3 dyspepsia  Plan: #1 headache diary should symptoms recur  #2 reculture urine. If she is having documented recurrent urinary tract infections, urologic evaluation to possibly include cystoscopy with the indicated to rule out bladder polyp, ulcer or other lesion which  might predispose her to infections.  #3 dyspepsia triggers listed. She's been requested to take the Prilosec over-the-counter 30 minutes before breakfast each day.  EKG reveals short PR interval; no ischemic ST-T  wave changes are present.

## 2010-12-05 ENCOUNTER — Ambulatory Visit: Payer: Self-pay | Admitting: Gastroenterology

## 2010-12-18 ENCOUNTER — Ambulatory Visit: Payer: Self-pay | Admitting: Gastroenterology

## 2011-02-18 ENCOUNTER — Telehealth: Payer: Self-pay

## 2011-02-18 DIAGNOSIS — Z0000 Encounter for general adult medical examination without abnormal findings: Secondary | ICD-10-CM

## 2011-02-18 DIAGNOSIS — E785 Hyperlipidemia, unspecified: Secondary | ICD-10-CM

## 2011-02-18 DIAGNOSIS — T887XXA Unspecified adverse effect of drug or medicament, initial encounter: Secondary | ICD-10-CM

## 2011-02-18 NOTE — Telephone Encounter (Signed)
Patient left message on voicemail that she has a CPX scheduled for Jan 2013, patient would like to have labs done tomorrow if possible since she is off work .  I contacted patient and scheduled lab appointment for tomorrow, patient informed to fast. Future orders placed (Regina/Lab Tech) informed NMR to be done also

## 2011-02-19 ENCOUNTER — Other Ambulatory Visit (INDEPENDENT_AMBULATORY_CARE_PROVIDER_SITE_OTHER): Payer: Commercial Managed Care - PPO

## 2011-02-19 ENCOUNTER — Other Ambulatory Visit: Payer: Self-pay | Admitting: Internal Medicine

## 2011-02-19 DIAGNOSIS — R319 Hematuria, unspecified: Secondary | ICD-10-CM

## 2011-02-19 DIAGNOSIS — Z0000 Encounter for general adult medical examination without abnormal findings: Secondary | ICD-10-CM

## 2011-02-19 DIAGNOSIS — T887XXA Unspecified adverse effect of drug or medicament, initial encounter: Secondary | ICD-10-CM

## 2011-02-19 DIAGNOSIS — E785 Hyperlipidemia, unspecified: Secondary | ICD-10-CM

## 2011-02-19 LAB — CBC WITH DIFFERENTIAL/PLATELET
Basophils Absolute: 0 10*3/uL (ref 0.0–0.1)
Eosinophils Relative: 0.4 % (ref 0.0–5.0)
HCT: 36.8 % (ref 36.0–46.0)
Lymphs Abs: 1.5 10*3/uL (ref 0.7–4.0)
MCV: 88.1 fl (ref 78.0–100.0)
Monocytes Absolute: 0.4 10*3/uL (ref 0.1–1.0)
Monocytes Relative: 7.1 % (ref 3.0–12.0)
Neutrophils Relative %: 62.2 % (ref 43.0–77.0)
Platelets: 207 10*3/uL (ref 150.0–400.0)
RDW: 13.3 % (ref 11.5–14.6)
WBC: 5.2 10*3/uL (ref 4.5–10.5)

## 2011-02-19 LAB — POCT URINALYSIS DIPSTICK
Glucose, UA: NEGATIVE
Nitrite, UA: NEGATIVE
Protein, UA: NEGATIVE
Spec Grav, UA: 1.015
Urobilinogen, UA: 0.2

## 2011-02-19 LAB — HEPATIC FUNCTION PANEL
ALT: 24 U/L (ref 0–35)
AST: 21 U/L (ref 0–37)
Albumin: 4.1 g/dL (ref 3.5–5.2)
Alkaline Phosphatase: 67 U/L (ref 39–117)

## 2011-02-19 LAB — BASIC METABOLIC PANEL
CO2: 29 mEq/L (ref 19–32)
Calcium: 9.2 mg/dL (ref 8.4–10.5)
Creatinine, Ser: 1.2 mg/dL (ref 0.4–1.2)
GFR: 50.16 mL/min — ABNORMAL LOW (ref >60.00)
Sodium: 141 mEq/L (ref 135–145)

## 2011-02-19 LAB — TSH: TSH: 0.96 u[IU]/mL (ref 0.35–5.50)

## 2011-02-21 LAB — URINE CULTURE: Colony Count: 8000

## 2011-03-04 ENCOUNTER — Encounter: Payer: Self-pay | Admitting: Internal Medicine

## 2011-03-04 ENCOUNTER — Telehealth: Payer: Self-pay | Admitting: *Deleted

## 2011-03-04 NOTE — Telephone Encounter (Signed)
Pt reports that she received lab results in mail but that there was no Lipid Profile Francis Dowse has upcoming CPE 01.03.12] and would like to know if she needs to have this lab done prior to OV.? There is an order for NMP, lipoprofile that is showing "result final--sent to [facility]" but there are no results in the order; Pt asks also should she refill her pravastatin.? Please advise.

## 2011-03-04 NOTE — Telephone Encounter (Signed)
[facility] results & statin will be  discussed at her appointment. Copy mailed

## 2011-03-04 NOTE — Telephone Encounter (Signed)
Informed patient

## 2011-03-09 ENCOUNTER — Encounter: Payer: Self-pay | Admitting: Internal Medicine

## 2011-03-12 ENCOUNTER — Ambulatory Visit (INDEPENDENT_AMBULATORY_CARE_PROVIDER_SITE_OTHER): Payer: Commercial Managed Care - PPO | Admitting: Internal Medicine

## 2011-03-12 ENCOUNTER — Encounter: Payer: Self-pay | Admitting: Internal Medicine

## 2011-03-12 DIAGNOSIS — Z0000 Encounter for general adult medical examination without abnormal findings: Secondary | ICD-10-CM

## 2011-03-12 DIAGNOSIS — E785 Hyperlipidemia, unspecified: Secondary | ICD-10-CM

## 2011-03-12 MED ORDER — SIMVASTATIN 20 MG PO TABS: 20.0000 mg | ORAL_TABLET | Freq: Every day | ORAL | Status: DC

## 2011-03-12 NOTE — Progress Notes (Signed)
Subjective:    Patient ID: Penny Mccormick, female    DOB: 1955-12-14, 56 y.o.   MRN: [id number]  HPI  Penny Mccormick  is here for a physical;acute issues include bladder issues with microscopic hematuria  for which she is on prophylactic antibiotics from Dr Brunilda Payor.      Review of Systems Her symptoms are intermittent pressure &  discomfort in the suprapubic area without trigger. She specifically denies dysuria, hematuria, pyuria, or discharge. She is also seeing her gynecologist for these symptoms. Patient reports no significant  vision/ hearing  changes, adenopathy,fever, weight change,  persistant / recurrent hoarseness , swallowing issues, chest pain,palpitations,edema,persistant /recurrent cough, hemoptysis, dyspnea( rest/ exertional/paroxysmal nocturnal), gastrointestinal bleeding(melena, rectal bleeding), abdominal pain, significant heartburn,  bowel changes,  syncope, focal weakness, memory loss, skin/hair /nail changes,abnormal bruising or bleeding, anxiety,or depression.  She has intermittent numbness and tingling in her hands upon awakening     Objective:   Physical Exam Gen.: Healthy and well-nourished in appearance. Alert, appropriate and cooperative throughout exam. Head: Normocephalic without obvious abnormalities  Eyes: No corneal or conjunctival inflammation noted. Pupils equal round reactive to light and accommodation. Fundal exam is benign without hemorrhages, exudate, papilledema. Extraocular motion intact. Vision grossly normal. Ears: External  ear exam reveals no significant lesions or deformities. Canals clear .TMs normal. Hearing is grossly normal bilaterally. Nose: External nasal exam reveals no deformity or inflammation. Nasal mucosa are pink and moist. No lesions or exudates noted.   Mouth: Oral mucosa and oropharynx reveal no lesions or exudates. Teeth in good repair. Neck: No deformities, masses, or tenderness noted. Range of motion & . Thyroid normal. Lungs: Normal  respiratory effort; chest expands symmetrically. Lungs are clear to auscultation without rales, wheezes, or increased work of breathing. Heart: Normal rate and rhythm. Normal S1 and S2. No gallop, click, or rub. S 4 w/o murmur. Abdomen: Bowel sounds normal; abdomen soft and nontender. No masses, organomegaly or hernias noted. Genitalia: Dr Ernestina Penna, [facility]   .                                                                                   Musculoskeletal/extremities: No deformity or scoliosis noted of  the thoracic or lumbar spine. No clubbing, cyanosis, edema, or deformity noted. Range of motion  normal .Tone & strength  normal.Joints normal. Nail health  good. Vascular: Carotid, radial artery, dorsalis pedis and  posterior tibial pulses are full and equal. No bruits present. Neurologic: Alert and oriented x3. Deep tendon reflexes symmetrical and normal.          Skin: Intact without suspicious lesions or rashes. Lymph: No cervical, axillary lymphadenopathy present. Psych: Mood and affect are normal. Normally interactive  Assessment & Plan:  #1 comprehensive physical exam; no acute findings  #2 chronic bladder issues with microscopic hematuria monitored by her gynecologist and urologist #3see Problem List with Assessments & Recommendations Plan: see Orders

## 2011-03-12 NOTE — Patient Instructions (Addendum)
Preventive Health Care: Exercise  30-45  minutes a day, 3-4 days a week. Walking is especially valuable in preventing Osteoporosis. Eat a low-fat diet with lots of fruits and vegetables, up to 7-9 servings per day. Consume less than 30 grams of sugar per day from foods & drinks with High Fructose Corn Syrup as # 1,2,3 or #4 on label.   BUN, creatinine, and GFR  all assess kidney function. To protect the kidneys it  is important to control your blood pressure and sugar. You should also stay well hydrated. Drink to thirst, up to 40 ounces of water a day.  Please  schedule fasting Labs : CK,Lipids, AST/ALT in 10-12 weeks. PLEASE BRING THESE INSTRUCTIONS TO FOLLOW UP  LAB APPOINTMENT.This will guarantee correct labs are drawn, eliminating need for repeat blood sampling ( needle sticks ! ). Diagnoses /Codes: 272.4,995.20.

## 2011-05-14 ENCOUNTER — Other Ambulatory Visit: Payer: Self-pay

## 2011-07-13 ENCOUNTER — Encounter: Payer: Self-pay | Admitting: Family Medicine

## 2011-07-13 ENCOUNTER — Ambulatory Visit (INDEPENDENT_AMBULATORY_CARE_PROVIDER_SITE_OTHER): Payer: Commercial Managed Care - PPO | Admitting: Family Medicine

## 2011-07-13 VITALS — BP 114/72 | HR 69 | Temp 97.9°F | Wt 153.4 lb

## 2011-07-13 DIAGNOSIS — R350 Frequency of micturition: Secondary | ICD-10-CM

## 2011-07-13 LAB — POCT URINALYSIS DIPSTICK
Bilirubin, UA: NEGATIVE
Glucose, UA: NEGATIVE
Ketones, UA: NEGATIVE
Protein, UA: NEGATIVE
Spec Grav, UA: 1.01

## 2011-07-13 MED ORDER — CIPROFLOXACIN HCL 500 MG PO TABS: 500.0000 mg | ORAL_TABLET | Freq: Two times a day (BID) | ORAL | Status: AC

## 2011-07-13 NOTE — Patient Instructions (Signed)

## 2011-07-13 NOTE — Progress Notes (Signed)
  Subjective:    Penny Mccormick is a 56 y.o. female who complains of burning with urination, frequency and urgency. She has had symptoms for a few days. Patient also complains of none. Patient denies back pain, congestion, cough, fever, headache, rhinitis, sorethroat, stomach ache and vaginal discharge. Patient does have a history of recurrent UTI. Patient does not have a history of pyelonephritis.   The following portions of the patient's history were reviewed and updated as appropriate: allergies, current medications, past family history, past medical history, past social history, past surgical history and problem list.  Review of Systems Pertinent items are noted in HPI.    Objective:    BP 114/72  Pulse 69  Temp(Src) 97.9 F (36.6 C) (Oral)  Wt 153 lb 6.4 oz (69.582 kg)  SpO2 99% General appearance: alert, cooperative, appears stated age and no distress Neurologic: Grossly normal  Laboratory:  Urine dipstick: mod for hemoglobin and trace for leukocyte esterase.   Micro exam: not done.    Assessment:    urinary urgency     Plan:    Medications: ciprofloxacin. Maintain adequate hydration. Follow up if symptoms not improving, and as needed.  Pt has appointment with gyn Thursday

## 2011-07-15 LAB — URINE CULTURE: Colony Count: 2000

## 2011-07-24 ENCOUNTER — Encounter: Payer: Self-pay | Admitting: Internal Medicine

## 2011-07-27 ENCOUNTER — Telehealth: Payer: Self-pay

## 2011-07-27 NOTE — Telephone Encounter (Signed)
Message copied by Arnette Norris on Mon Jul 27, 2011  4:07 PM ------      Message from: Pecola Lawless      Created: Thu Jul 16, 2011  6:22 PM       With the microscopic hematuria and negative culture; she needs to followup with her urologist. I believe  she sees Dr. Brunilda Payor ? Thanks, [employer]

## 2011-07-27 NOTE — Telephone Encounter (Signed)
msg let for a return call.     KP

## 2011-07-30 NOTE — Telephone Encounter (Signed)
Discuss with patient  

## 2011-12-21 ENCOUNTER — Encounter: Payer: Self-pay | Admitting: Family Medicine

## 2011-12-21 ENCOUNTER — Ambulatory Visit (INDEPENDENT_AMBULATORY_CARE_PROVIDER_SITE_OTHER): Payer: Commercial Managed Care - PPO | Admitting: Family Medicine

## 2011-12-21 VITALS — BP 110/80 | HR 82 | Temp 98.4°F | Ht 63.25 in | Wt 151.0 lb

## 2011-12-21 DIAGNOSIS — J40 Bronchitis, not specified as acute or chronic: Secondary | ICD-10-CM

## 2011-12-21 MED ORDER — AMOXICILLIN-POT CLAVULANATE 875-125 MG PO TABS: 1.0000 | ORAL_TABLET | Freq: Two times a day (BID) | ORAL | Status: DC

## 2011-12-21 NOTE — Patient Instructions (Signed)

## 2011-12-21 NOTE — Progress Notes (Signed)
  Subjective:     Penny Mccormick is a 56 y.o. female here for evaluation of a cough. Onset of symptoms was 10 days ago. Symptoms have been gradually worsening since that time. The cough is dry and is aggravated by nothing. Associated symptoms include: chills, shortness of breath, wheezing and chest tightness. Patient does not have a history of asthma. Patient does not have a history of environmental allergens. Patient has not traveled recently. Patient does not have a history of smoking. Patient has not had a previous chest x-ray. Patient has not had a PPD done.  The following portions of the patient's history were reviewed and updated as appropriate: allergies, current medications, past family history, past medical history, past social history, past surgical history and problem list.  Review of Systems Pertinent items are noted in HPI.    Objective:    Oxygen saturation 97% on room air BP 110/80  Pulse 82  Temp 98.4 F (36.9 C) (Oral)  Ht 5' 3.25" (1.607 m)  Wt 151 lb (68.493 kg)  BMI 26.54 kg/m2  SpO2 97% General appearance: alert, cooperative, appears stated age and no distress Ears: normal TM's and external ear canals both ears Nose: Nares normal. Septum midline. Mucosa normal. No drainage or sinus tenderness. Throat: abnormal findings: mild oropharyngeal erythema and pnd Neck: mild anterior cervical adenopathy, supple, symmetrical, trachea midline and thyroid not enlarged, symmetric, no tenderness/mass/nodules Lungs: clear to auscultation bilaterally Heart: S1, S2 normal    Assessment:    Acute Bronchitis    Plan:    Antibiotics per medication orders. Avoid exposure to tobacco smoke and fumes. Call if shortness of breath worsens, blood in sputum, change in character of cough, development of fever or chills, inability to maintain nutrition and hydration. Avoid exposure to tobacco smoke and fumes. Trial of antihistamines. mucinex or robitussin

## 2011-12-24 ENCOUNTER — Ambulatory Visit: Payer: Commercial Managed Care - PPO | Admitting: Family Medicine

## 2012-02-08 ENCOUNTER — Ambulatory Visit (INDEPENDENT_AMBULATORY_CARE_PROVIDER_SITE_OTHER): Payer: Commercial Managed Care - PPO | Admitting: Internal Medicine

## 2012-02-08 ENCOUNTER — Encounter: Payer: Self-pay | Admitting: Internal Medicine

## 2012-02-08 VITALS — BP 116/72 | HR 98 | Temp 98.3°F | Wt 153.8 lb

## 2012-02-08 DIAGNOSIS — J209 Acute bronchitis, unspecified: Secondary | ICD-10-CM

## 2012-02-08 DIAGNOSIS — J069 Acute upper respiratory infection, unspecified: Secondary | ICD-10-CM

## 2012-02-08 MED ORDER — AZITHROMYCIN 250 MG PO TABS: ORAL_TABLET | ORAL | Status: DC

## 2012-02-08 MED ORDER — FLUTICASONE PROPIONATE 50 MCG/ACT NA SUSP: 1.0000 | Freq: Two times a day (BID) | NASAL | Status: DC | PRN

## 2012-02-08 NOTE — Patient Instructions (Addendum)
Plain Mucinex for thick secretions ;force NON dairy fluids . Use a Neti pot daily as needed for sinus congestion; going from open side to congested side . Nasal cleansing in the shower as discussed. Make sure that all residual soap is removed to prevent irritation. Fluticasone 1 spray in each nostril twice a day as needed. Use the "crossover" technique as discussed. Plain Allegra 160 daily as needed for itchy eyes & sneezing.    

## 2012-02-08 NOTE — Progress Notes (Signed)
  Subjective:    Patient ID: Penny Mccormick, female    DOB: Nov 29, 1955, 56 y.o.   MRN: [id number]  HPI Symptoms began 01/28/12 has had congestion and rhinitis. Over the last 3 days she's had a nonproductive cough with chest tightness. She denies associated shortness of breath or wheezing.  She's had scant yellow discharge from her nose with paranasal pain. She has also had some bitemporal headaches.  Mucinex DM and Claritin were of minimal benefit  In mid to late October she did take a course of Augmentin for similar illness which resolved completely.   She works in a Pediatric office    Review of Systems She had no extrinsic symptoms of signifcant  itchy, watery eyes or sneezing. She also denied fever, chills, or sweats. She has not had frontal headache, dental pain, sore throat, or otic pain. She's had some tinnitus on the left     Objective:   Physical Exam General appearance:good health ;well nourished; no acute distress or increased work of breathing is present.  No  lymphadenopathy about the head, neck, or axilla noted.   Eyes: No conjunctival inflammation or lid edema is present. There is no scleral icterus. EOMI ; differential vision due to Lasik  Ears:  External ear exam shows no significant lesions or deformities.  Otoscopic examination reveals clear canals, tympanic membranes are intact bilaterally without bulging, retraction, inflammation or discharge.  Nose:  External nasal examination shows no deformity or inflammation. Nasal mucosa are pink and moist without lesions or exudates. No septal dislocation or deviation.No obstruction to airflow.   Oral exam: Dental hygiene is good; lips and gums are healthy appearing.There is no oropharyngeal erythema or exudate noted.   Neck:  No deformities,  masses, or tenderness noted.    Heart:  Normal rate and regular rhythm. S1 and S2 normal without gallop, murmur, click, rub or other extra sounds.   Lungs:Chest clear to  auscultation; no wheezes, rhonchi,rales ,or rubs present.No increased work of breathing.    Extremities:  No cyanosis, edema, or clubbing  noted    Skin: Warm & dry          Assessment & Plan:  #1 acute bronchitis w/o bronchospasm #2 URI Plan: See orders and recommendations

## 2012-04-11 ENCOUNTER — Other Ambulatory Visit: Payer: Self-pay | Admitting: Internal Medicine

## 2012-04-11 NOTE — Telephone Encounter (Signed)
Pending appointment 05/2011

## 2012-04-23 ENCOUNTER — Other Ambulatory Visit: Payer: Self-pay

## 2012-05-12 ENCOUNTER — Ambulatory Visit (INDEPENDENT_AMBULATORY_CARE_PROVIDER_SITE_OTHER): Payer: Commercial Managed Care - PPO | Admitting: Internal Medicine

## 2012-05-12 ENCOUNTER — Encounter: Payer: Self-pay | Admitting: Internal Medicine

## 2012-05-12 VITALS — BP 102/64 | HR 75 | Temp 98.3°F | Resp 12 | Ht 63.5 in | Wt 149.0 lb

## 2012-05-12 DIAGNOSIS — Z0000 Encounter for general adult medical examination without abnormal findings: Secondary | ICD-10-CM

## 2012-05-12 DIAGNOSIS — E785 Hyperlipidemia, unspecified: Secondary | ICD-10-CM

## 2012-05-12 DIAGNOSIS — N959 Unspecified menopausal and perimenopausal disorder: Secondary | ICD-10-CM

## 2012-05-12 LAB — CBC WITH DIFFERENTIAL/PLATELET
Basophils Relative: 0.6 % (ref 0.0–3.0)
Eosinophils Relative: 0.1 % (ref 0.0–5.0)
Lymphocytes Relative: 31.2 % (ref 12.0–46.0)
Neutrophils Relative %: 60.5 % (ref 43.0–77.0)
Platelets: 204 10*3/uL (ref 150.0–400.0)
RBC: 4.28 Mil/uL (ref 3.87–5.11)
WBC: 5.3 10*3/uL (ref 4.5–10.5)

## 2012-05-12 LAB — HEPATIC FUNCTION PANEL
ALT: 18 U/L (ref 0–35)
Alkaline Phosphatase: 66 U/L (ref 39–117)
Bilirubin, Direct: 0 mg/dL (ref 0.0–0.3)
Total Bilirubin: 0.7 mg/dL (ref 0.3–1.2)
Total Protein: 6.9 g/dL (ref 6.0–8.3)

## 2012-05-12 LAB — LIPID PANEL
HDL: 54.3 mg/dL (ref >39.00)
LDL Cholesterol: 94 mg/dL (ref 0–99)
Total CHOL/HDL Ratio: 3

## 2012-05-12 LAB — BASIC METABOLIC PANEL
Calcium: 8.8 mg/dL (ref 8.4–10.5)
Creatinine, Ser: 1 mg/dL (ref 0.4–1.2)
GFR: 60.34 mL/min (ref >60.00)

## 2012-05-12 LAB — TSH: TSH: 1.15 u[IU]/mL (ref 0.35–5.50)

## 2012-05-12 MED ORDER — SIMVASTATIN 20 MG PO TABS: ORAL_TABLET | ORAL | Status: DC

## 2012-05-12 NOTE — Patient Instructions (Addendum)
Preventive Health Care: Exercise  30-45  minutes a day, 3-4 days a week. Walking is especially valuable in preventing Osteoporosis. Eat a low-fat diet with lots of fruits and vegetables, up to 7-9 servings per day. This would eliminate need for vitamin supplements for most individuals. Consider trial of nocturnal wrist(s) for CTS symptoms. Use a cervical memory foam pillow to prevent hyperextension or hyperflexion of the cervical spine.Use an anti-inflammatory cream such as Aspercreme or Zostrix cream twice a day to the upper back as needed. In lieu of this warm moist compresses or  hot water bottle can be used. Do not apply ice . To prevent palpitations or premature beats, avoid stimulants such as decongestants, diet pills, nicotine, or caffeine (coffee, tea, cola, or chocolate) to excess.  Review and correct the record as indicated. Please share record with all medical staff seen.

## 2012-05-12 NOTE — Progress Notes (Signed)
Subjective:    Patient ID: Penny Mccormick, female    DOB: 02/13/1956, 57 y.o.   MRN: [id number]  HPI Penny Mccormick is here for a physical; she has acute back pain issues issues .     Review of Systems The pain began last night in upper R thoracic area upon awakening; it actually has been intermittent since December in context of coughing with RTI.  It is described as dull acjing up to level 6. The pain does not radiate. The discomfort lasts constantly. It is exacerbated by head movement. No associated redness, swelling ,stiffness ,skin color change, or temperature change. The pain was treated with NSAIDS ; response was partial .   Negative or absent signs& symptoms are delineated below: Constitutional: no fever, chills, sweats, change in weight  Skin:no rash Neuro: no weakness or incontinence (stool/urine). Occasional bilateral UE numbness and tingling upon awakening. Heme:no lymphadenopathy; abnormal bruising or bleeding                                                                       She is on a heart healthy diet; she exercises @ gym once weekly for 30 minutes  without symptoms. Specifically she denies chest pain, palpitations, dyspnea, or claudication. Family history is positive for premature coronary disease in her maternal grandmother. Advanced cholesterol testing reveals her LDL goal is less than 100. There is medication compliance with the statin. Significant reflux treated by Dr Norma Fredrickson.No significant memory deficit or myalgias denied. .     Objective:   Physical Exam Gen.: Healthy and well-nourished in appearance. Alert, appropriate and cooperative throughout exam. Appears younger than stated age  Head: Normocephalic without obvious abnormalities Eyes: No corneal or conjunctival inflammation noted.  Extraocular motion intact. Vision grossly normal without lenses Ears: External  ear exam reveals no significant lesions or deformities. Canals clear .TMs normal. Hearing  is grossly normal bilaterally. Nose: External nasal exam reveals no deformity or inflammation. Nasal mucosa are pink and moist. No lesions or exudates noted.   Mouth: Oral mucosa and oropharynx reveal no lesions or exudates. Teeth in good repair. Neck: No deformities, masses, or tenderness noted. Range of motion decreased due to pain. Thyroid normal. Lungs: Normal respiratory effort; chest expands symmetrically. Lungs are clear to auscultation without rales, wheezes, or increased work of breathing. Heart: Normal rate and rhythm. Normal S1 and S2. No gallop, click, or rub. S4 w/o murmur. Abdomen: Bowel sounds normal; abdomen soft and nontender. No masses, organomegaly or hernias noted. Genitalia: As per Gyn, Dr Ernestina Penna                               Musculoskeletal/extremities: Minimally accentuated curvature of upper thoracic spine. No clubbing, cyanosis, edema, or significant extremity  deformity noted. Range of motion normal .Tone & strength  Normal.Knee crepitus. Joints normal. Nail health good. Able to lie down & sit up w/o help. Negative SLR bilaterally Vascular: Carotid, radial artery, dorsalis pedis and  posterior tibial pulses are full and equal. No bruits present. Neurologic: Alert and oriented x3. Deep tendon reflexes symmetrical and normal.  Negative Tinel's.     Skin: Intact without suspicious lesions or rashes. Lymph: No cervical, axillary  lymphadenopathy present. Psych: Mood and affect are normal. 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

## 2012-06-20 ENCOUNTER — Encounter: Payer: Self-pay | Admitting: Internal Medicine

## 2012-10-26 ENCOUNTER — Encounter: Payer: Self-pay | Admitting: Internal Medicine

## 2013-01-12 ENCOUNTER — Other Ambulatory Visit: Payer: Self-pay

## 2013-04-18 ENCOUNTER — Ambulatory Visit (INDEPENDENT_AMBULATORY_CARE_PROVIDER_SITE_OTHER): Payer: PRIVATE HEALTH INSURANCE | Admitting: Internal Medicine

## 2013-04-18 ENCOUNTER — Encounter: Payer: Self-pay | Admitting: Internal Medicine

## 2013-04-18 VITALS — BP 118/78 | HR 88 | Temp 98.6°F | Ht 63.5 in | Wt 157.0 lb

## 2013-04-18 DIAGNOSIS — J019 Acute sinusitis, unspecified: Secondary | ICD-10-CM

## 2013-04-18 MED ORDER — HYDROCODONE-HOMATROPINE 5-1.5 MG/5ML PO SYRP: 5.0000 mL | ORAL_SOLUTION | Freq: Four times a day (QID) | ORAL | Status: DC | PRN

## 2013-04-18 MED ORDER — AMOXICILLIN-POT CLAVULANATE 875-125 MG PO TABS: 1.0000 | ORAL_TABLET | Freq: Two times a day (BID) | ORAL | Status: DC

## 2013-04-18 NOTE — Progress Notes (Signed)
   Subjective:    Patient ID: Penny Mccormick, female    DOB: October 29, 1955, 58 y.o.   MRN: [id number]  HPI  Her symptoms began 03/15/13 as a sore throat. This was associated with fatigue. She's also had itchy eyes and sneezing. She describes a barky cough and chest tightness.  She has had yellow nasal discharge.  She's also had some discomfort in the right ear.  She denies frontal headache or facial pain but does have nasal pressure. She is Pediatric Nurse Practitioner.     Review of Systems No fever ,chills or sweats.No sputum ,dyspnea or wheezing. No myalgia.  She has no associated otic discharge.      Objective:   Physical Exam General appearance:good health ;well nourished; no acute distress or increased work of breathing is present.  No  lymphadenopathy about the head, neck, or axilla noted.   Eyes: No conjunctival inflammation or lid edema is present. There is no scleral icterus.  Ears:  External ear exam shows no significant lesions or deformities.  Otoscopic examination reveals clear canals, tympanic membranes are intact bilaterally without bulging, retraction, inflammation or discharge.  Nose:  External nasal examination shows no deformity or inflammation. Nasal mucosa are pink and moist without lesions or exudates. No septal dislocation or deviation.No obstruction to airflow.   Oral exam: Dental hygiene is good; lips and gums are healthy appearing.There is slight oropharyngeal erythema or exudate noted. Small uvula.   Neck:  No deformities,  masses, or tenderness noted.   Supple with full range of motion without pain.   Heart:  Normal rate and regular rhythm. S1 and S2 normal without gallop, murmur, click, rub or other extra sounds.   Lungs:Chest clear to auscultation; no wheezes, rhonchi,rales ,or rubs present.No increased work of breathing. Dry cough  Extremities:  No cyanosis, edema, or clubbing  noted    Skin: Warm & dry w/o jaundice or tenting.           Assessment & Plan:  #1 rhinosinusitis without significant bronchitis. #2 working in heath care setting  Plan: Nasal hygiene interventions discussed. See prescription medications

## 2013-04-18 NOTE — Progress Notes (Signed)
Pre-visit discussion using our clinic review tool. No additional management support is needed unless otherwise documented below in the visit note.  

## 2013-04-18 NOTE — Patient Instructions (Signed)

## 2013-06-01 ENCOUNTER — Other Ambulatory Visit (INDEPENDENT_AMBULATORY_CARE_PROVIDER_SITE_OTHER): Payer: PRIVATE HEALTH INSURANCE

## 2013-06-01 ENCOUNTER — Ambulatory Visit (INDEPENDENT_AMBULATORY_CARE_PROVIDER_SITE_OTHER): Payer: PRIVATE HEALTH INSURANCE | Admitting: Internal Medicine

## 2013-06-01 ENCOUNTER — Encounter: Payer: Self-pay | Admitting: Internal Medicine

## 2013-06-01 VITALS — BP 106/70 | HR 67 | Temp 97.6°F | Ht 63.0 in | Wt 156.0 lb

## 2013-06-01 DIAGNOSIS — K219 Gastro-esophageal reflux disease without esophagitis: Secondary | ICD-10-CM

## 2013-06-01 DIAGNOSIS — Z0000 Encounter for general adult medical examination without abnormal findings: Secondary | ICD-10-CM

## 2013-06-01 DIAGNOSIS — E785 Hyperlipidemia, unspecified: Secondary | ICD-10-CM

## 2013-06-01 LAB — CBC WITH DIFFERENTIAL/PLATELET
BASOS PCT: 0.3 % (ref 0.0–3.0)
Basophils Absolute: 0 10*3/uL (ref 0.0–0.1)
EOS PCT: 0.8 % (ref 0.0–5.0)
Eosinophils Absolute: 0 10*3/uL (ref 0.0–0.7)
HCT: 38.6 % (ref 36.0–46.0)
HEMOGLOBIN: 12.9 g/dL (ref 12.0–15.0)
LYMPHS ABS: 2.1 10*3/uL (ref 0.7–4.0)
Lymphocytes Relative: 36.6 % (ref 12.0–46.0)
MCHC: 33.4 g/dL (ref 30.0–36.0)
MCV: 86.3 fl (ref 78.0–100.0)
Monocytes Absolute: 0.4 10*3/uL (ref 0.1–1.0)
Monocytes Relative: 7.6 % (ref 3.0–12.0)
Neutro Abs: 3.2 10*3/uL (ref 1.4–7.7)
Neutrophils Relative %: 54.7 % (ref 43.0–77.0)
Platelets: 206 10*3/uL (ref 150.0–400.0)
RBC: 4.48 Mil/uL (ref 3.87–5.11)
RDW: 13.1 % (ref 11.5–14.6)
WBC: 5.9 10*3/uL (ref 4.5–10.5)

## 2013-06-01 LAB — LIPID PANEL
Cholesterol: 186 mg/dL (ref 0–200)
HDL: 60.6 mg/dL (ref >39.00)
LDL CALC: 103 mg/dL — AB (ref 0–99)
Total CHOL/HDL Ratio: 3
Triglycerides: 110 mg/dL (ref 0.0–149.0)
VLDL: 22 mg/dL (ref 0.0–40.0)

## 2013-06-01 LAB — HEPATIC FUNCTION PANEL
ALT: 25 U/L (ref 0–35)
AST: 27 U/L (ref 0–37)
Albumin: 4.3 g/dL (ref 3.5–5.2)
Alkaline Phosphatase: 72 U/L (ref 39–117)
Bilirubin, Direct: 0.1 mg/dL (ref 0.0–0.3)
Total Bilirubin: 0.4 mg/dL (ref 0.3–1.2)
Total Protein: 7.1 g/dL (ref 6.0–8.3)

## 2013-06-01 LAB — BASIC METABOLIC PANEL
BUN: 14 mg/dL (ref 6–23)
CO2: 32 mEq/L (ref 19–32)
Calcium: 9.5 mg/dL (ref 8.4–10.5)
Chloride: 102 mEq/L (ref 96–112)
Creatinine, Ser: 1.1 mg/dL (ref 0.4–1.2)
GFR: 53.9 mL/min — AB (ref >60.00)
Glucose, Bld: 93 mg/dL (ref 70–99)
POTASSIUM: 4.4 meq/L (ref 3.5–5.1)
Sodium: 139 mEq/L (ref 135–145)

## 2013-06-01 LAB — TSH: TSH: 1.42 u[IU]/mL (ref 0.35–5.50)

## 2013-06-01 MED ORDER — TRAMADOL HCL 50 MG PO TABS: 50.0000 mg | ORAL_TABLET | Freq: Three times a day (TID) | ORAL | Status: DC | PRN

## 2013-06-01 MED ORDER — SIMVASTATIN 20 MG PO TABS: ORAL_TABLET | ORAL | Status: AC

## 2013-06-01 NOTE — Progress Notes (Signed)
   Subjective:    Patient ID: Penny Mccormick, female    DOB: Jul 14, 1955, 58 y.o.   MRN: [id number]  HPI She is here for a physical;acute issues include recent exacerbation of GERD. Follow up planned with Dr Alice Reichert.     Review of Systems  A heart healthy diet is followed; exercise encompasses 30 minutes 3  times per week as  treadmill without symptoms.  Family history is + for premature coronary disease. Advanced cholesterol testing reveals  LDL goal is less than 100 ; ideally < 70. There is medication compliance with the statin.  Low dose ASA not taken Specifically denied are  Exertional chest pain, palpitations, dyspnea, or claudication.  Significant dyspepsia symptoms with SS discomfort but  w/o dysphagia. Some leg myalgias @ day's end.Advil helps.     Objective:   Physical Exam Gen.: Healthy and well-nourished in appearance. Alert, appropriate and cooperative throughout exam. Appears younger than stated age  Head: Normocephalic without obvious abnormalities  Eyes: No corneal or conjunctival inflammation noted. Pupils equal round reactive to light and accommodation. Extraocular motion intact.  Ears: External  ear exam reveals no significant lesions or deformities. Canals clear .TMs normal. Hearing is grossly normal bilaterally. Nose: External nasal exam reveals no deformity or inflammation. Nasal mucosa are pink and moist. No lesions or exudates noted.   Mouth: Oral mucosa and oropharynx reveal no lesions or exudates. Teeth in good repair. Neck: No deformities, masses, or tenderness noted. Range of motion decrease. Lipoma R occipital area.. Thyroid normal Lungs: Normal respiratory effort; chest expands symmetrically. Lungs are clear to auscultation without rales, wheezes, or increased work of breathing. Heart: Normal rate and rhythm. Normal S1 and S2. No gallop, click, or rub. No murmur. Abdomen: Bowel sounds normal; abdomen soft and nontender. No masses, organomegaly or hernias  noted. Genitalia: as per Gyn                                  Musculoskeletal/extremities: No deformity or scoliosis noted of  the thoracic or lumbar spine.    No clubbing, cyanosis, edema, or significant extremity  deformity noted. Range of motion normal .Tone & strength normal. Hand joints normal Fingernail  health good. Able to lie down & sit up w/o help. Negative SLR bilaterally Vascular: Carotid, radial artery, dorsalis pedis and  posterior tibial pulses are full and equal. No bruits present. Neurologic: Alert and oriented x3. Deep tendon reflexes symmetrical and normal.  Gait normal . Skin: Intact without suspicious lesions or rashes. Lymph: No cervical, axillary lymphadenopathy present. Psych: Mood and affect are normal. Normally interactive                                                                                        Assessment & Plan:  #1 comprehensive physical exam; no acute findings #2 significant GERD with chest discomfort  Plan: see Orders  & Recommendations

## 2013-06-01 NOTE — Progress Notes (Signed)
Pre visit review using our clinic review tool, if applicable. No additional management support is needed unless otherwise documented below in the visit note. 

## 2013-06-01 NOTE — Patient Instructions (Addendum)
Reflux of gastric acid may be asymptomatic as this may occur mainly during sleep.The triggers for reflux  include stress; the "aspirin family" ; alcohol; peppermint; and caffeine (coffee, tea, cola, and chocolate). The aspirin family would include aspirin and the nonsteroidal agents such as ibuprofen &  Naproxen. Tylenol would not cause reflux. If having symptoms ; food & drink should be avoided for @ least 2 hours before going to bed. Stay on PPI daily until seen by Dr Alice Reichert. Your next office appointment will be determined based upon review of your pending labs. Those instructions will be transmitted to you through My Chart . Followup as needed for your acute issue. Please report any significant change in your symptoms.

## 2013-07-05 ENCOUNTER — Telehealth: Payer: Self-pay | Admitting: *Deleted

## 2013-07-05 ENCOUNTER — Ambulatory Visit: Payer: PRIVATE HEALTH INSURANCE | Admitting: Family Medicine

## 2013-07-05 NOTE — Telephone Encounter (Signed)
Augmentin he is an excellent choice for sinusitis as it treats resistant organisms. It must be taken every 12 hours with a meal. I do recommend aggressive nasal hygiene as follows: Plain Mucinex (NOT D) for thick secretions ;force NON dairy fluids .   Nasal cleansing in the shower with lather of mild shampoo.After 10 seconds wash off lather while  exhaling through nostrils. Make sure that all residual soap is removed to prevent irritation.  Flonase OR Nasacort AQ 1 spray in each nostril twice a day as needed. Use the "crossover" technique into opposite nostril spraying toward opposite ear @ 45 degree angle, not straight up into nostril.  Use a Neti pot daily only  as needed for significant sinus congestion; going from open side to congested side . Plain Allegra (NOT D )  160 daily , Loratidine 10 mg , OR Zyrtec 10 mg @ bedtime  as needed for itchy eyes & sneezing.

## 2013-07-05 NOTE — Telephone Encounter (Signed)
Pt called states she was seen at Urgent Care on Sunday, diagnosed with Sinus Infection, prescribed Augmentin.  Pt states symptoms not improving.  Please advise

## 2013-07-05 NOTE — Telephone Encounter (Signed)
Spoke with pt advised of MDs message 

## 2013-07-18 ENCOUNTER — Encounter: Payer: Self-pay | Admitting: Internal Medicine

## 2013-07-18 ENCOUNTER — Ambulatory Visit (INDEPENDENT_AMBULATORY_CARE_PROVIDER_SITE_OTHER): Payer: PRIVATE HEALTH INSURANCE | Admitting: Internal Medicine

## 2013-07-18 VITALS — BP 106/74 | HR 83 | Temp 98.3°F | Resp 13 | Wt 156.2 lb

## 2013-07-18 DIAGNOSIS — J310 Chronic rhinitis: Secondary | ICD-10-CM

## 2013-07-18 DIAGNOSIS — R059 Cough, unspecified: Secondary | ICD-10-CM

## 2013-07-18 DIAGNOSIS — R05 Cough: Secondary | ICD-10-CM

## 2013-07-18 MED ORDER — CEFUROXIME AXETIL 500 MG PO TABS: 500.0000 mg | ORAL_TABLET | Freq: Two times a day (BID) | ORAL | Status: DC

## 2013-07-18 NOTE — Patient Instructions (Signed)

## 2013-07-18 NOTE — Progress Notes (Signed)
   Subjective:    Patient ID: Penny Mccormick, female    DOB: March 29, 1955, 58 y.o.   MRN: [id number]  HPI   For 3 weeks she has had respiratory tract symptoms;initially she had cough in the context of rhinitis and nasal congestion.  She has also had some scant green sputum in the morning but none today  She has had associated postnasal drainage associated with slight sore throat. There's been scant yellow nasal discharge as well  She's been treated by a staff member in her office with Augmentin . On 5/5 she received a shot of Depo-Medrol and started high-dose burst of steroids 5/11-5/15.   Review of Systems  Other symptoms include fatigue.  She denies persistent frontal headache, facial pain, or dental pain.  She has no otic pain or otic discharge  She is not having fever, chills, sweats      Objective:   Physical Exam General appearance:good health ;well nourished; no acute distress or increased work of breathing is present.  No  lymphadenopathy about the head, neck, or axilla noted.   Eyes: No conjunctival inflammation or lid edema is present. There is no scleral icterus.  Ears:  External ear exam shows no significant lesions or deformities.  Otoscopic examination reveals clear canals, tympanic membranes are intact bilaterally without bulging, retraction, inflammation or discharge.  Nose:  External nasal examination shows no deformity or inflammation. Nasal mucosa are dry without lesions; minimal exudate present. No septal dislocation or deviation.No obstruction to airflow.   Oral exam: Dental hygiene is good; lips and gums are healthy appearing.There is minimal oropharyngeal erythema ; no exudate noted.   Neck:  No deformities, thyromegaly, masses, or tenderness noted.   Supple with full range of motion without pain.   Heart:  Normal rate and regular rhythm. S1 and S2 normal without gallop, murmur, click, rub or other extra sounds.   Lungs:Chest clear to auscultation; no  wheezes, rhonchi,rales ,or rubs present.No increased work of breathing.    Extremities:  No cyanosis, edema, or clubbing  noted    Skin: Warm & dry         Assessment & Plan:  #1 allergic rhinitis. Criteria for rhinosinusitis not present See orders & AVS

## 2013-07-18 NOTE — Progress Notes (Signed)
Pre visit review using our clinic review tool, if applicable. No additional management support is needed unless otherwise documented below in the visit note. 

## 2013-12-08 ENCOUNTER — Encounter: Payer: Self-pay | Admitting: Gastroenterology

## 2014-01-09 ENCOUNTER — Other Ambulatory Visit: Payer: Self-pay

## 2014-01-09 MED ORDER — TRAMADOL HCL 50 MG PO TABS
50.0000 mg | ORAL_TABLET | Freq: Three times a day (TID) | ORAL | Status: DC | PRN
Start: 2014-01-09 — End: 2023-10-22

## 2014-01-09 NOTE — Telephone Encounter (Signed)
Tramadol has been called to [facility]  

## 2014-01-09 NOTE — Telephone Encounter (Signed)
OK X1 

## 2014-04-15 ENCOUNTER — Encounter: Payer: Self-pay | Admitting: Internal Medicine

## 2014-04-17 ENCOUNTER — Encounter: Payer: Self-pay | Admitting: Internal Medicine

## 2014-07-16 ENCOUNTER — Encounter: Payer: Self-pay | Admitting: Gastroenterology

## 2023-10-22 ENCOUNTER — Inpatient Hospital Stay: Attending: Internal Medicine

## 2023-10-22 ENCOUNTER — Inpatient Hospital Stay (HOSPITAL_BASED_OUTPATIENT_CLINIC_OR_DEPARTMENT_OTHER): Admitting: Hematology and Oncology

## 2023-10-22 ENCOUNTER — Other Ambulatory Visit: Payer: Self-pay | Admitting: Medical Genetics

## 2023-10-22 VITALS — BP 120/76 | HR 78 | Temp 97.0°F | Resp 17 | Wt 146.6 lb

## 2023-10-22 DIAGNOSIS — E785 Hyperlipidemia, unspecified: Secondary | ICD-10-CM | POA: Diagnosis not present

## 2023-10-22 DIAGNOSIS — Z8042 Family history of malignant neoplasm of prostate: Secondary | ICD-10-CM | POA: Diagnosis not present

## 2023-10-22 DIAGNOSIS — D472 Monoclonal gammopathy: Secondary | ICD-10-CM | POA: Diagnosis present

## 2023-10-22 DIAGNOSIS — Z801 Family history of malignant neoplasm of trachea, bronchus and lung: Secondary | ICD-10-CM | POA: Insufficient documentation

## 2023-10-22 DIAGNOSIS — K219 Gastro-esophageal reflux disease without esophagitis: Secondary | ICD-10-CM | POA: Diagnosis not present

## 2023-10-22 DIAGNOSIS — Z79899 Other long term (current) drug therapy: Secondary | ICD-10-CM | POA: Insufficient documentation

## 2023-10-22 DIAGNOSIS — Z803 Family history of malignant neoplasm of breast: Secondary | ICD-10-CM | POA: Diagnosis not present

## 2023-10-22 DIAGNOSIS — Z808 Family history of malignant neoplasm of other organs or systems: Secondary | ICD-10-CM | POA: Diagnosis not present

## 2023-10-22 DIAGNOSIS — F32A Depression, unspecified: Secondary | ICD-10-CM | POA: Diagnosis not present

## 2023-10-22 LAB — CMP (CANCER CENTER ONLY)
ALT: 19 U/L (ref 0–44)
AST: 18 U/L (ref 15–41)
Albumin: 4.1 g/dL (ref 3.5–5.0)
Alkaline Phosphatase: 70 U/L (ref 38–126)
Anion gap: 6 (ref 5–15)
BUN: 19 mg/dL (ref 8–23)
CO2: 28 mmol/L (ref 22–32)
Calcium: 9.2 mg/dL (ref 8.9–10.3)
Chloride: 103 mmol/L (ref 98–111)
Creatinine: 1.32 mg/dL — ABNORMAL HIGH (ref 0.44–1.00)
GFR, Estimated: 44 mL/min — ABNORMAL LOW (ref >60)
Glucose, Bld: 101 mg/dL — ABNORMAL HIGH (ref 70–99)
Potassium: 4.1 mmol/L (ref 3.5–5.1)
Sodium: 137 mmol/L (ref 135–145)
Total Bilirubin: 0.3 mg/dL (ref 0.0–1.2)
Total Protein: 6.6 g/dL (ref 6.5–8.1)

## 2023-10-22 LAB — CBC WITH DIFFERENTIAL (CANCER CENTER ONLY)
Abs Immature Granulocytes: 0.02 K/uL (ref 0.00–0.07)
Basophils Absolute: 0 K/uL (ref 0.0–0.1)
Basophils Relative: 0 %
Eosinophils Absolute: 0 K/uL (ref 0.0–0.5)
Eosinophils Relative: 0 %
HCT: 36 % (ref 36.0–46.0)
Hemoglobin: 11.7 g/dL — ABNORMAL LOW (ref 12.0–15.0)
Immature Granulocytes: 0 %
Lymphocytes Relative: 25 %
Lymphs Abs: 1.9 K/uL (ref 0.7–4.0)
MCH: 27.9 pg (ref 26.0–34.0)
MCHC: 32.5 g/dL (ref 30.0–36.0)
MCV: 85.7 fL (ref 80.0–100.0)
Monocytes Absolute: 0.5 K/uL (ref 0.1–1.0)
Monocytes Relative: 7 %
Neutro Abs: 5.1 K/uL (ref 1.7–7.7)
Neutrophils Relative %: 68 %
Platelet Count: 260 K/uL (ref 150–400)
RBC: 4.2 MIL/uL (ref 3.87–5.11)
RDW: 13.9 % (ref 11.5–15.5)
WBC Count: 7.6 K/uL (ref 4.0–10.5)
nRBC: 0 % (ref 0.0–0.2)

## 2023-10-22 LAB — LACTATE DEHYDROGENASE: LDH: 143 U/L (ref 98–192)

## 2023-10-22 NOTE — Progress Notes (Signed)
 [facility] [phone]   [phone]  INITIAL CONSULT NOTE  Patient Care Team: Pcp, No as PCP - General  Hematological/Oncological History # IgM Kappa Monoclonal Gammopathy   CHIEF COMPLAINTS/PURPOSE OF CONSULTATION:  IgM Kappa Monoclonal Gammopathy    HISTORY OF PRESENTING ILLNESS:  Penny Mccormick 68 y.o. female with medical history significant for depression, GERD, hyperlipidemia, and lumbago who presents for evaluation of elevated IgM kappa M protein.   On review of the previous records Penny Mccormick had an SPEP drawn by her orthopedic surgeon due to concern for recent compression fractures.  Reportedly the patient was found to have an IgM kappa M protein.  Due to concern for this finding the patient was referred to hematology for further evaluation and management.  On exam today Penny Mccormick reports that she is a retired Restaurant manager, fast food.  She notes that she has not had issues with CKD over the last several years and the etiology has been unclear.  She is not under the care of a nephrologist.  This worsened over the last 3 to 4 years.  She reports that she has good control of her blood pressure and blood sugars.  She notes that she is not having any lower extremity edema and does not have any urinary abnormality such as dark urine or bubbling or foaming in the urine.  She also does not have any issues with neuropathy or nerve pain.  On further discussion the patient reports that her brother was diagnosed with lymphoma in 2012.  She had a maternal uncle with leukemia.  Her paternal grandfather had lung cancer, paternal grandmother had breast cancer, father had prostate cancer, and she had an uncle with melanoma.  She reports that she is a never smoker and has not been drinking alcohol due to GERD.  She otherwise denies any fevers, chills, sweats, nausea, vomiting or diarrhea.  She denies any weight loss.  A full 10 point ROS is otherwise  negative.  MEDICAL HISTORY:  Past Medical History:  Diagnosis Date   Anemia    PMH of   Anxiety state, unspecified    PMH of   Depressive disorder, not elsewhere classified    PMH of   GERD (gastroesophageal reflux disease)    Lumbago    Other and unspecified hyperlipidemia    LDL goal = < 100, ideally < 80.   Personal history of colonic polyps 2010   Hyperplastic   Unspecified menopausal and postmenopausal disorder     SURGICAL HISTORY: Past Surgical History:  Procedure Laterality Date   COLONOSCOPY W/ POLYPECTOMY  2010   Dr Debrah (as of 2013 seeing Dr Aundria)   REFRACTIVE SURGERY  2009   bilaterally   TONSILLECTOMY AND ADENOIDECTOMY     TUBAL LIGATION     UPPER GASTROINTESTINAL ENDOSCOPY  2005   esophageal polyps ; negative 2011    SOCIAL HISTORY: Social History   Socioeconomic History   Marital status: Married    Spouse name: Not on file   Number of children: 2   Years of education: Not on file   Highest education level: Not on file  Occupational History   Occupation: Air cabin crew    Employer: [employer]    Comment: [employer]  Tobacco Use   Smoking status: Never   Smokeless tobacco: Never  Substance and Sexual Activity   Alcohol use: Yes    Comment: Rarely   Drug use: No   Sexual activity: Not on file  Other Topics Concern   Not on file  Social History Narrative   Not on file   Social Drivers of Health   Financial Resource Strain: Not on file  Food Insecurity: No Food Insecurity (10/22/2023)   Hunger Vital Sign    Worried About Running Out of Food in the Last Year: Never true    Ran Out of Food in the Last Year: Never true  Transportation Needs: No Transportation Needs (10/22/2023)   PRAPARE - Administrator, Civil Service (Medical): No    Lack of Transportation (Non-Medical): No  Physical Activity: Not on file  Stress: Not on file  Social Connections: Not on file  Intimate Partner Violence: Not At Risk  (10/22/2023)   Humiliation, Afraid, Rape, and Kick questionnaire    Fear of Current or Ex-Partner: No    Emotionally Abused: No    Physically Abused: No    Sexually Abused: No    FAMILY HISTORY: Family History  Problem Relation Age of Onset   Heart attack Maternal Grandmother        in 64s   Hypertension Paternal Aunt    Lung cancer Paternal Grandfather        smoker   Esophageal cancer Paternal Grandfather    Cervical cancer Paternal Grandmother    Breast cancer Paternal Grandmother    Dementia Paternal Grandmother    Prostate cancer Father    Diabetes Mother    Hypertension Mother    Diabetes Maternal Grandfather    Stroke Maternal Grandfather        in 58s   Colon cancer Neg Hx    Heart attack Paternal Grandmother        in 82s   Lymphoma Brother     ALLERGIES:  has no known allergies.  MEDICATIONS:  Current Outpatient Medications  Medication Sig Dispense Refill   ALPRAZolam (XANAX) 0.5 MG tablet Take 0.5 mg by mouth as needed.       FLUoxetine (PROZAC) 20 MG capsule Take 20 mg by mouth daily.       simvastatin (ZOCOR) 20 MG tablet TAKE 1 TABLET BY MOUTH AT BEDTIME 90 tablet 3   No current facility-administered medications for this visit.    REVIEW OF SYSTEMS:   Constitutional: ( - ) fevers, ( - )  chills , ( - ) night sweats Eyes: ( - ) blurriness of vision, ( - ) double vision, ( - ) watery eyes Ears, nose, mouth, throat, and face: ( - ) mucositis, ( - ) sore throat Respiratory: ( - ) cough, ( - ) dyspnea, ( - ) wheezes Cardiovascular: ( - ) palpitation, ( - ) chest discomfort, ( - ) lower extremity swelling Gastrointestinal:  ( - ) nausea, ( - ) heartburn, ( - ) change in bowel habits Skin: ( - ) abnormal skin rashes Lymphatics: ( - ) new lymphadenopathy, ( - ) easy bruising Neurological: ( - ) numbness, ( - ) tingling, ( - ) new weaknesses Behavioral/Psych: ( - ) mood change, ( - ) new changes  All other systems were reviewed with the patient and are  negative.  PHYSICAL EXAMINATION:  Vitals:   10/22/23 1330  BP: 120/76  Pulse: 78  Resp: 17  Temp: (!) 97 F (36.1 C)  SpO2: 97%   Filed Weights   10/22/23 1330  Weight: 146 lb 9.6 oz (66.5 kg)    GENERAL: well appearing middle-age Caucasian female in NAD  SKIN: skin color, texture, turgor are normal,  no rashes or significant lesions EYES: conjunctiva are pink and non-injected, sclera clear LUNGS: clear to auscultation and percussion with normal breathing effort HEART: regular rate & rhythm and no murmurs and no lower extremity edema Musculoskeletal: no cyanosis of digits and no clubbing  PSYCH: alert & oriented x 3, fluent speech NEURO: no focal motor/sensory deficits  LABORATORY DATA:  I have reviewed the data as listed    Latest Ref Rng & Units 10/22/2023    2:48 PM 06/01/2013   10:58 AM 05/12/2012    9:44 AM  CBC  WBC 4.0 - 10.5 K/uL 7.6  5.9  5.3   Hemoglobin 12.0 - 15.0 g/dL 88.2  87.0  87.6   Hematocrit 36.0 - 46.0 % 36.0  38.6  36.9   Platelets 150 - 400 K/uL 260  206.0  204.0        Latest Ref Rng & Units 10/22/2023    2:48 PM 06/01/2013   10:58 AM 05/12/2012    9:44 AM  CMP  Glucose 70 - 99 mg/dL 898  93  86   BUN 8 - 23 mg/dL 19  14  16    Creatinine 0.44 - 1.00 mg/dL 8.67  1.1  1.0   Sodium 135 - 145 mmol/L 137  139  138   Potassium 3.5 - 5.1 mmol/L 4.1  4.4  4.1   Chloride 98 - 111 mmol/L 103  102  103   CO2 22 - 32 mmol/L 28  32  27   Calcium 8.9 - 10.3 mg/dL 9.2  9.5  8.8   Total Protein 6.5 - 8.1 g/dL 6.6  7.1  6.9   Total Bilirubin 0.0 - 1.2 mg/dL 0.3  0.4  0.7   Alkaline Phos 38 - 126 U/L 70  72  66   AST 15 - 41 U/L 18  27  19    ALT 0 - 44 U/L 19  25  18       ASSESSMENT & PLAN Penny Mccormick 68 y.o. female with medical history significant for depression, GERD, hyperlipidemia, and lumbago who presents for evaluation of elevated IgG kappa M protein.   After review of the labs, review of the records, and discussion with the patient the patients  findings are most consistent with an IgG kappa monoclonal gammopathy.   Monoclonal Gammopathies are a group of medical conditions defined by the presence of a monoclonal protein (an M protein) in the blood or urine. Monoclonal gammopathies include monoclonal gammopathy of unknown significance (MGUS), Monoclonal gammopathies of renal or neurological significance,  smoldering multiple myeloma (SMM), multiple myeloma (MM), AL amyloidosis, and Waldenstrom macroglobulinemia. The goal of the initial workup is to determine which monoclonal gammopathy a patient has. The workup consists of evaluating protein in the serum (with serum protein electrophoresis (SPEP) and serum free light chains) , evaluating protein in the urine (UPEP), and evaluation of the skeleton (DG Bone Met Survey) to assure no lytic lesions. Baseline bloodwork includes CMP and CBC. If no CRAB criteria or high risk criteria are noted then the diagnosis is MGUS. MGUS must be followed with bloodwork periodically to assure it does not convert to multiple myeloma (occurs to approximately 1% of patients per year). If there are CRAB criteria or high risk features (such as elevated serum free light chain ratio (taking into account renal function), a non IgG M protein, or M protein >1.5) then a bone marrow biopsy must be pursued.    #IgM Kappa Monoclonal Gammopathy of Undetermined Significance --today  will order an SPEP, UPEP, SFLC and beta 2 microglobulin --additionally will collect new baseline CBC, CMP, and LDH --recommend a metastatic bone survey to assess for lytic lesions --based on prior results patient will required a bone marrow biopsy.  --RTC in 6 months or sooner if intervention is required.   Orders Placed This Encounter  Procedures   DG Bone Survey Met    Standing Status:   Future    Expected Date:   10/29/2023    Expiration Date:   10/21/2024    Reason for Exam (SYMPTOM  OR DIAGNOSIS REQUIRED):   MGUS, please assess for lytic lesions     Preferred imaging location?:   [facility]   CT BONE MARROW BIOPSY & ASPIRATION    Standing Status:   Future    Expected Date:   10/29/2023    Expiration Date:   10/21/2024    Reason for Exam (SYMPTOM  OR DIAGNOSIS REQUIRED):   high risk MGUS, requesting bone marrow biopsy    Preferred location?:   [facility]   CT BIOPSY    Standing Status:   Future    Expected Date:   10/29/2023    Expiration Date:   10/21/2024    Lab orders requested (DO NOT place separate lab orders, these will be automatically ordered during procedure specimen collection)::   Surgical Pathology    Reason for Exam (SYMPTOM  OR DIAGNOSIS REQUIRED):   high risk MGUS, requesting bone marrow biopsy    Preferred location?:   [facility]   CBC with Differential (Cancer Center Only)    Standing Status:   Future    Number of Occurrences:   1    Expiration Date:   10/21/2024   CMP (Cancer Center only)    Standing Status:   Future    Number of Occurrences:   1    Expiration Date:   10/21/2024   Lactate dehydrogenase (LDH)    Standing Status:   Future    Number of Occurrences:   1    Expiration Date:   10/21/2024   Multiple Myeloma Panel (SPEP&IFE w/QIG)    Standing Status:   Future    Number of Occurrences:   1    Expiration Date:   10/21/2024   Kappa/lambda light chains    Standing Status:   Future    Number of Occurrences:   1    Expiration Date:   10/21/2024   Beta 2 microglobulin    Standing Status:   Future    Number of Occurrences:   1    Expiration Date:   10/21/2024   24-Hr Ur UPEP/UIFE/Light Chains/TP    Standing Status:   Future    Expiration Date:   10/21/2024    All questions were answered. The patient knows to call the clinic with any problems, questions or concerns.  A total of more than 60 minutes were spent on this encounter with face-to-face time and non-face-to-face time, including preparing to see the patient, ordering tests and/or medications, counseling the patient and  coordination of care as outlined above.   Norleen IVAR Kidney, MD Department of Hematology/Oncology [facility] Phone: [phone] Pager: [phone] Email: [email or web address]  10/24/2023 6:47 PM

## 2023-10-25 LAB — MULTIPLE MYELOMA PANEL, SERUM
Albumin SerPl Elph-Mcnc: 3.6 g/dL (ref 2.9–4.4)
Albumin/Glob SerPl: 1.2 (ref 0.7–1.7)
Alpha 1: 0.3 g/dL (ref 0.0–0.4)
Alpha2 Glob SerPl Elph-Mcnc: 0.8 g/dL (ref 0.4–1.0)
B-Globulin SerPl Elph-Mcnc: 0.9 g/dL (ref 0.7–1.3)
Gamma Glob SerPl Elph-Mcnc: 1.1 g/dL (ref 0.4–1.8)
Globulin, Total: 3.1 g/dL (ref 2.2–3.9)
IgA: 86 mg/dL — ABNORMAL LOW (ref 87–352)
IgG (Immunoglobin G), Serum: 631 mg/dL (ref 586–1602)
IgM (Immunoglobulin M), Srm: 655 mg/dL — ABNORMAL HIGH (ref 26–217)
M Protein SerPl Elph-Mcnc: 0.6 g/dL — ABNORMAL HIGH
Total Protein ELP: 6.7 g/dL (ref 6.0–8.5)

## 2023-10-25 LAB — KAPPA/LAMBDA LIGHT CHAINS
Kappa free light chain: 18.7 mg/L (ref 3.3–19.4)
Kappa, lambda light chain ratio: 1.29 (ref 0.26–1.65)
Lambda free light chains: 14.5 mg/L (ref 5.7–26.3)

## 2023-10-25 LAB — BETA 2 MICROGLOBULIN, SERUM: Beta-2 Microglobulin: 1.9 mg/L (ref 0.6–2.4)

## 2023-10-28 DIAGNOSIS — D472 Monoclonal gammopathy: Secondary | ICD-10-CM | POA: Diagnosis not present

## 2023-10-29 ENCOUNTER — Ambulatory Visit (HOSPITAL_COMMUNITY)
Admission: RE | Admit: 2023-10-29 | Discharge: 2023-10-29 | Disposition: A | Discharge status: Home / Self Care | Source: Ambulatory Visit | Attending: Hematology and Oncology | Admitting: Hematology and Oncology

## 2023-10-29 DIAGNOSIS — D472 Monoclonal gammopathy: Secondary | ICD-10-CM | POA: Diagnosis present

## 2023-11-02 ENCOUNTER — Other Ambulatory Visit: Payer: Self-pay | Admitting: Radiology

## 2023-11-02 DIAGNOSIS — D472 Monoclonal gammopathy: Secondary | ICD-10-CM

## 2023-11-02 LAB — UPEP/UIFE/LIGHT CHAINS/TP, 24-HR UR
% BETA, Urine: 0 %
ALPHA 1 URINE: 0 %
Albumin, U: 0 %
Alpha 2, Urine: 0 %
Free Kappa Lt Chains,Ur: 12.53 mg/L (ref 1.17–86.46)
Free Kappa/Lambda Ratio: 10.53 (ref 1.83–14.26)
Free Lambda Lt Chains,Ur: 1.19 mg/L (ref 0.27–15.21)
GAMMA GLOBULIN URINE: 0 %
Total Protein, Urine-Ur/day: <112 mg/(24.h) (ref 30–150)
Total Protein, Urine: <4 mg/dL
Total Volume: 2800

## 2023-11-03 NOTE — H&P (Signed)
 Chief Complaint: Monoclonal gammopathy of uncertain significance; referred for image guided bone marrow biopsy for further evaluation  Referring Provider(s): Dorsey,J  Supervising Physician: Jennefer Rover  Patient Status: [facility] OP  History of Present Illness: Penny Mccormick is a 68 y.o. female with past medical history significant for anemia, HLD, anxiety, depression, GERD, lumbago who presents now with monoclonal gammopathy of uncertain significance.  She is scheduled today for image guided bone marrow biopsy for further evaluation.  *** Patient is Full Code  Past Medical History:  Diagnosis Date   Anemia    PMH of   Anxiety state, unspecified    PMH of   Depressive disorder, not elsewhere classified    PMH of   GERD (gastroesophageal reflux disease)    Lumbago    Other and unspecified hyperlipidemia    LDL goal = < 100, ideally < 80.   Personal history of colonic polyps 2010   Hyperplastic   Unspecified menopausal and postmenopausal disorder     Past Surgical History:  Procedure Laterality Date   COLONOSCOPY W/ POLYPECTOMY  2010   Dr Debrah (as of 2013 seeing Dr Aundria)   REFRACTIVE SURGERY  2009   bilaterally   TONSILLECTOMY AND ADENOIDECTOMY     TUBAL LIGATION     UPPER GASTROINTESTINAL ENDOSCOPY  2005   esophageal polyps ; negative 2011    Allergies: Patient has no known allergies.  Medications: Prior to Admission medications   Medication Sig Start Date End Date Taking? Authorizing Provider  ALPRAZolam (XANAX) 0.5 MG tablet Take 0.5 mg by mouth as needed.      [provider]  FLUoxetine (PROZAC) 20 MG capsule Take 20 mg by mouth daily.      [provider]  simvastatin  (ZOCOR ) 20 MG tablet TAKE 1 TABLET BY MOUTH AT BEDTIME 06/01/13   Tish Elsie FALCON, MD     Family History  Problem Relation Age of Onset   Heart attack Maternal Grandmother        in 41s   Hypertension Paternal Aunt    Lung cancer Paternal Grandfather        smoker    Esophageal cancer Paternal Grandfather    Cervical cancer Paternal Grandmother    Breast cancer Paternal Grandmother    Dementia Paternal Grandmother    Prostate cancer Father    Diabetes Mother    Hypertension Mother    Diabetes Maternal Grandfather    Stroke Maternal Grandfather        in 48s   Colon cancer Neg Hx    Heart attack Paternal Grandmother        in 23s   Lymphoma Brother     Social History   Socioeconomic History   Marital status: Married    Spouse name: Not on file   Number of children: 2   Years of education: Not on file   Highest education level: Not on file  Occupational History   Occupation: Special educational needs teacher: [employer]    Comment: [employer]  Tobacco Use   Smoking status: Never   Smokeless tobacco: Never  Substance and Sexual Activity   Alcohol use: Yes    Comment: Rarely   Drug use: No   Sexual activity: Not on file  Other Topics Concern   Not on file  Social History Narrative   Not on file   Social Drivers of Health   Financial Resource Strain: Not on file  Food Insecurity: No Food Insecurity (  10/22/2023)   Hunger Vital Sign    Worried About Running Out of Food in the Last Year: Never true    Ran Out of Food in the Last Year: Never true  Transportation Needs: No Transportation Needs (10/22/2023)   PRAPARE - Administrator, Civil Service (Medical): No    Lack of Transportation (Non-Medical): No  Physical Activity: Not on file  Stress: Not on file  Social Connections: Not on file       Review of Systems  Vital Signs:   Advance Care Plan: no documents on file  Physical Exam  Imaging: No results found.  Labs:  CBC: Recent Labs    10/22/23 1448  WBC 7.6  HGB 11.7*  HCT 36.0  PLT 260    COAGS: No results for input(s): INR, APTT in the last 8760 hours.  BMP: Recent Labs    10/22/23 1448  NA 137  K 4.1  CL 103  CO2 28  GLUCOSE 101*  BUN 19  CALCIUM 9.2   CREATININE 1.32*  GFRNONAA 44*    LIVER FUNCTION TESTS: Recent Labs    10/22/23 1448  BILITOT 0.3  AST 18  ALT 19  ALKPHOS 70  PROT 6.6  ALBUMIN 4.1    TUMOR MARKERS: No results for input(s): AFPTM, CEA, CA199, CHROMGRNA in the last 8760 hours.  Assessment and Plan: 68 y.o. female with past medical history significant for anemia, HLD, anxiety, depression, GERD, lumbago who presents now with monoclonal gammopathy of uncertain significance.  She is scheduled today for image guided bone marrow biopsy for further evaluation.Risks and benefits of procedure was discussed with the patient  including, but not limited to bleeding, infection, damage to adjacent structures or low yield requiring additional tests.  All of the questions were answered and there is agreement to proceed.  Consent signed and in chart.    Thank you for allowing our service to participate in YESLIN DELIO 's care.  Electronically Signed: D. Franky Rakers, PA-C   11/03/2023, 3:13 PM      I spent a total of  15 minutes   in face to face in clinical consultation, greater than 50% of which was counseling/coordinating care for image guided bone marrow biopsy

## 2023-11-04 ENCOUNTER — Other Ambulatory Visit: Payer: Self-pay

## 2023-11-04 ENCOUNTER — Encounter (HOSPITAL_COMMUNITY): Payer: Self-pay

## 2023-11-04 ENCOUNTER — Ambulatory Visit (HOSPITAL_COMMUNITY)
Admission: RE | Admit: 2023-11-04 | Discharge: 2023-11-04 | Disposition: A | Discharge status: Home / Self Care | Source: Ambulatory Visit | Attending: Hematology and Oncology

## 2023-11-04 ENCOUNTER — Ambulatory Visit (HOSPITAL_COMMUNITY)
Admission: RE | Admit: 2023-11-04 | Discharge: 2023-11-04 | Disposition: A | Discharge status: Home / Self Care | Source: Ambulatory Visit | Attending: Hematology and Oncology | Admitting: Hematology and Oncology

## 2023-11-04 DIAGNOSIS — Z1379 Encounter for other screening for genetic and chromosomal anomalies: Secondary | ICD-10-CM | POA: Diagnosis not present

## 2023-11-04 DIAGNOSIS — K219 Gastro-esophageal reflux disease without esophagitis: Secondary | ICD-10-CM | POA: Diagnosis not present

## 2023-11-04 DIAGNOSIS — F32A Depression, unspecified: Secondary | ICD-10-CM | POA: Insufficient documentation

## 2023-11-04 DIAGNOSIS — E785 Hyperlipidemia, unspecified: Secondary | ICD-10-CM | POA: Insufficient documentation

## 2023-11-04 DIAGNOSIS — D472 Monoclonal gammopathy: Secondary | ICD-10-CM | POA: Diagnosis present

## 2023-11-04 DIAGNOSIS — F419 Anxiety disorder, unspecified: Secondary | ICD-10-CM | POA: Insufficient documentation

## 2023-11-04 DIAGNOSIS — D72822 Plasmacytosis: Secondary | ICD-10-CM | POA: Diagnosis not present

## 2023-11-04 HISTORY — PX: IR BONE MARROW BIOPSY & ASPIRATION: IMG5727

## 2023-11-04 LAB — CBC WITH DIFFERENTIAL/PLATELET
Abs Immature Granulocytes: 0.02 K/uL (ref 0.00–0.07)
Basophils Absolute: 0 K/uL (ref 0.0–0.1)
Basophils Relative: 0 %
Eosinophils Absolute: 0.1 K/uL (ref 0.0–0.5)
Eosinophils Relative: 1 %
HCT: 39.1 % (ref 36.0–46.0)
Hemoglobin: 12.1 g/dL (ref 12.0–15.0)
Immature Granulocytes: 0 %
Lymphocytes Relative: 28 %
Lymphs Abs: 1.8 K/uL (ref 0.7–4.0)
MCH: 27.2 pg (ref 26.0–34.0)
MCHC: 30.9 g/dL (ref 30.0–36.0)
MCV: 87.9 fL (ref 80.0–100.0)
Monocytes Absolute: 0.5 K/uL (ref 0.1–1.0)
Monocytes Relative: 8 %
Neutro Abs: 4.1 K/uL (ref 1.7–7.7)
Neutrophils Relative %: 63 %
Platelets: 243 K/uL (ref 150–400)
RBC: 4.45 MIL/uL (ref 3.87–5.11)
RDW: 14.3 % (ref 11.5–15.5)
WBC: 6.5 K/uL (ref 4.0–10.5)
nRBC: 0 % (ref 0.0–0.2)

## 2023-11-04 MED ORDER — SODIUM CHLORIDE 0.9 % IV SOLN: INTRAVENOUS | Status: DC

## 2023-11-04 MED ORDER — LIDOCAINE HCL (PF) 1 % IJ SOLN
30.0000 mL | Freq: Once | INTRAMUSCULAR | Status: AC
  Administered 2023-11-04: 10 mL via INTRADERMAL

## 2023-11-04 MED ORDER — LIDOCAINE HCL (PF) 1 % IJ SOLN
INTRAMUSCULAR | Status: AC
  Filled 2023-11-04: qty 30

## 2023-11-04 MED ORDER — FENTANYL CITRATE (PF) 100 MCG/2ML IJ SOLN
INTRAMUSCULAR | Status: AC | PRN
  Administered 2023-11-04 (×2): 50 ug via INTRAVENOUS

## 2023-11-04 MED ORDER — MIDAZOLAM HCL 2 MG/2ML IJ SOLN
INTRAMUSCULAR | Status: AC | PRN
Start: 2023-11-04 — End: 2023-11-04
  Administered 2023-11-04 (×2): 1 mg via INTRAVENOUS

## 2023-11-04 MED ORDER — MIDAZOLAM HCL 2 MG/2ML IJ SOLN
INTRAMUSCULAR | Status: AC
Start: 2023-11-04 — End: 2023-11-04
  Filled 2023-11-04: qty 2

## 2023-11-04 MED ORDER — FENTANYL CITRATE (PF) 100 MCG/2ML IJ SOLN
INTRAMUSCULAR | Status: AC
  Filled 2023-11-04: qty 2

## 2023-11-04 NOTE — Progress Notes (Signed)
 Post IR Nursing Note: Client alert and oriented, VSS, denies any pain or discomfort, tolerating POs, biopsy site WNL. BandAid still intact. Pt able to ambulate without assistance. AVS reviewed with client and copy provided. Opportunity for questions provided prior to DC to home with family.

## 2023-11-04 NOTE — Procedures (Signed)
Interventional Radiology Procedure Note  Procedure: Fluoroscopic guided aspirate and core biopsy of right iliac bone  Complications: None  Recommendations: - Bedrest supine x 1 hrs - Hydrocodone PRN  Pain - Follow biopsy results    , MD   

## 2023-11-10 LAB — SURGICAL PATHOLOGY

## 2023-11-16 ENCOUNTER — Encounter (HOSPITAL_COMMUNITY): Payer: Self-pay | Admitting: Hematology and Oncology

## 2023-11-26 ENCOUNTER — Ambulatory Visit: Payer: Self-pay | Admitting: *Deleted

## 2023-11-26 NOTE — Telephone Encounter (Signed)
 TCT patient regarding recent bone marrow biopsy, lab results. Spoke with her. Advised  that Dr. Federico reviewed the bone marrow biopsy, x-rays, and blood work/urine work.  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  There is no evidence of multiple  myeloma or lymphoma.  At this time he would recommend observation.  We will plan to see her back in 6 months time to repeat these labs and reassess. Pt voiced understanding.  Scheduling message sent

## 2023-11-26 NOTE — Progress Notes (Signed)
 Penny Mccormick called concerned about the final results from her BM biopsy, asked about FISH results. I explained that I would need to check with [place] because Dr Federico is off. I advised patient that I rene said results of BM were consistent with MGUS and FISH was not ordered. Per Dr Federico note patient is ok to follow up in 6 months. Patient stated that she is worried about her kidney function, I asked if her PCP is following that, she stated that they are and she sees them in November. Advised patient that if her PCP is concerned about anything we can see her sooner if needed.

## 2023-11-26 NOTE — Telephone Encounter (Signed)
-----   Message from Norleen ONEIDA Kidney IV sent at 11/21/2023 10:47 AM EDT ----- Please let Ms. Farinas know that we reviewed the bone marrow biopsy, x-rays, and blood work/urine work.  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  There is no evidence of multiple  myeloma or lymphoma.  At this time we would recommend observation.  We will plan to see her back in 6 months time to repeat these labs and reassess. ----- Message ----- From: Interface, Lab In [facility] Sent: 11/10/2023  12:10 PM EDT To: John T Dorsey IV, MD

## 2023-12-07 ENCOUNTER — Other Ambulatory Visit (HOSPITAL_COMMUNITY)
Admission: RE | Admit: 2023-12-07 | Discharge: 2023-12-07 | Disposition: A | Discharge status: Home / Self Care | Payer: Self-pay | Source: Ambulatory Visit | Attending: Medical Genetics | Admitting: Medical Genetics

## 2023-12-08 ENCOUNTER — Telehealth: Payer: Self-pay | Admitting: Internal Medicine

## 2023-12-08 NOTE — Telephone Encounter (Signed)
 Rescheduled appointments per the patients request.

## 2023-12-17 LAB — GENECONNECT MOLECULAR SCREEN: Genetic Analysis Overall Interpretation: NEGATIVE

## 2024-03-13 ENCOUNTER — Telehealth: Payer: Self-pay | Admitting: Hematology and Oncology

## 2024-03-13 NOTE — Telephone Encounter (Signed)
 I spoke with patient as she called in originally to change her lab appointment time on 04/20/2024. Patient aware of date/time change.

## 2024-04-14 ENCOUNTER — Telehealth: Payer: Self-pay | Admitting: Hematology and Oncology

## 2024-04-14 NOTE — Telephone Encounter (Signed)
 Pt called to move day of lab appt

## 2024-04-20 ENCOUNTER — Inpatient Hospital Stay

## 2024-04-20 ENCOUNTER — Other Ambulatory Visit

## 2024-04-21 ENCOUNTER — Inpatient Hospital Stay

## 2024-04-21 ENCOUNTER — Other Ambulatory Visit

## 2024-04-27 ENCOUNTER — Inpatient Hospital Stay: Admitting: Hematology and Oncology

## 2024-04-28 ENCOUNTER — Ambulatory Visit: Admitting: Hematology and Oncology
